# Patient Record
Sex: Male | Born: 1955 | Race: White | Hispanic: No | Marital: Married | Smoking: Former smoker
Health system: Southern US, Community
[De-identification: ages and names within clinical notes are randomized; demographics above are authoritative.]

## PROBLEM LIST (undated history)

## (undated) DIAGNOSIS — R7303 Prediabetes: Secondary | ICD-10-CM

## (undated) DIAGNOSIS — E559 Vitamin D deficiency, unspecified: Secondary | ICD-10-CM

## (undated) DIAGNOSIS — E785 Hyperlipidemia, unspecified: Secondary | ICD-10-CM

## (undated) DIAGNOSIS — I10 Essential (primary) hypertension: Secondary | ICD-10-CM

## (undated) HISTORY — DX: Vitamin D deficiency, unspecified: E55.9

## (undated) HISTORY — DX: Hyperlipidemia, unspecified: E78.5

## (undated) HISTORY — DX: Prediabetes: R73.03

## (undated) HISTORY — DX: Essential (primary) hypertension: I10

---

## 2002-10-24 ENCOUNTER — Ambulatory Visit (HOSPITAL_BASED_OUTPATIENT_CLINIC_OR_DEPARTMENT_OTHER): Admission: RE | Admit: 2002-10-24 | Discharge: 2002-10-24 | Payer: Self-pay | Admitting: Urology

## 2002-10-24 ENCOUNTER — Encounter: Payer: Self-pay | Admitting: Urology

## 2015-05-09 DIAGNOSIS — I10 Essential (primary) hypertension: Secondary | ICD-10-CM

## 2015-05-09 DIAGNOSIS — R7303 Prediabetes: Secondary | ICD-10-CM

## 2015-05-09 HISTORY — DX: Prediabetes: R73.03

## 2015-05-09 HISTORY — DX: Essential (primary) hypertension: I10

## 2015-05-21 ENCOUNTER — Encounter: Payer: Self-pay | Admitting: Internal Medicine

## 2015-05-25 ENCOUNTER — Ambulatory Visit (INDEPENDENT_AMBULATORY_CARE_PROVIDER_SITE_OTHER): Payer: BLUE CROSS/BLUE SHIELD | Admitting: Internal Medicine

## 2015-05-25 ENCOUNTER — Encounter: Payer: Self-pay | Admitting: Internal Medicine

## 2015-05-25 VITALS — BP 188/100 | HR 80 | Temp 97.0°F | Resp 16 | Ht 68.75 in | Wt 223.6 lb

## 2015-05-25 DIAGNOSIS — Z111 Encounter for screening for respiratory tuberculosis: Secondary | ICD-10-CM

## 2015-05-25 DIAGNOSIS — R7303 Prediabetes: Secondary | ICD-10-CM

## 2015-05-25 DIAGNOSIS — Z79899 Other long term (current) drug therapy: Secondary | ICD-10-CM | POA: Diagnosis not present

## 2015-05-25 DIAGNOSIS — R5383 Other fatigue: Secondary | ICD-10-CM

## 2015-05-25 DIAGNOSIS — Z0000 Encounter for general adult medical examination without abnormal findings: Secondary | ICD-10-CM | POA: Diagnosis not present

## 2015-05-25 DIAGNOSIS — I10 Essential (primary) hypertension: Secondary | ICD-10-CM | POA: Diagnosis not present

## 2015-05-25 DIAGNOSIS — Z1212 Encounter for screening for malignant neoplasm of rectum: Secondary | ICD-10-CM | POA: Diagnosis not present

## 2015-05-25 DIAGNOSIS — E669 Obesity, unspecified: Secondary | ICD-10-CM | POA: Insufficient documentation

## 2015-05-25 DIAGNOSIS — Z125 Encounter for screening for malignant neoplasm of prostate: Secondary | ICD-10-CM | POA: Diagnosis not present

## 2015-05-25 DIAGNOSIS — Z1211 Encounter for screening for malignant neoplasm of colon: Secondary | ICD-10-CM

## 2015-05-25 DIAGNOSIS — E785 Hyperlipidemia, unspecified: Secondary | ICD-10-CM

## 2015-05-25 DIAGNOSIS — B351 Tinea unguium: Secondary | ICD-10-CM

## 2015-05-25 DIAGNOSIS — Z6833 Body mass index (BMI) 33.0-33.9, adult: Secondary | ICD-10-CM

## 2015-05-25 DIAGNOSIS — K219 Gastro-esophageal reflux disease without esophagitis: Secondary | ICD-10-CM | POA: Insufficient documentation

## 2015-05-25 DIAGNOSIS — Z0001 Encounter for general adult medical examination with abnormal findings: Secondary | ICD-10-CM

## 2015-05-25 DIAGNOSIS — E782 Mixed hyperlipidemia: Secondary | ICD-10-CM | POA: Insufficient documentation

## 2015-05-25 DIAGNOSIS — E559 Vitamin D deficiency, unspecified: Secondary | ICD-10-CM

## 2015-05-25 LAB — CBC WITH DIFFERENTIAL/PLATELET
BASOS ABS: 0 10*3/uL (ref 0.0–0.1)
Basophils Relative: 1 % (ref 0–1)
EOS ABS: 0.1 10*3/uL (ref 0.0–0.7)
EOS PCT: 2 % (ref 0–5)
HEMATOCRIT: 50.5 % (ref 39.0–52.0)
Hemoglobin: 17.2 g/dL — ABNORMAL HIGH (ref 13.0–17.0)
LYMPHS ABS: 1.5 10*3/uL (ref 0.7–4.0)
LYMPHS PCT: 38 % (ref 12–46)
MCH: 30.7 pg (ref 26.0–34.0)
MCHC: 34.1 g/dL (ref 30.0–36.0)
MCV: 90.2 fL (ref 78.0–100.0)
MONO ABS: 0.4 10*3/uL (ref 0.1–1.0)
MPV: 9.9 fL (ref 8.6–12.4)
Monocytes Relative: 11 % (ref 3–12)
Neutro Abs: 1.9 10*3/uL (ref 1.7–7.7)
Neutrophils Relative %: 48 % (ref 43–77)
PLATELETS: 188 10*3/uL (ref 150–400)
RBC: 5.6 MIL/uL (ref 4.22–5.81)
RDW: 13.8 % (ref 11.5–15.5)
WBC: 4 10*3/uL (ref 4.0–10.5)

## 2015-05-25 LAB — BASIC METABOLIC PANEL WITH GFR
BUN: 10 mg/dL (ref 7–25)
CALCIUM: 9.7 mg/dL (ref 8.6–10.3)
CO2: 26 mmol/L (ref 20–31)
CREATININE: 1.09 mg/dL (ref 0.70–1.25)
Chloride: 102 mmol/L (ref 98–110)
GFR, EST AFRICAN AMERICAN: 85 mL/min (ref >=60)
GFR, Est Non African American: 73 mL/min (ref >=60)
GLUCOSE: 88 mg/dL (ref 65–99)
POTASSIUM: 4.3 mmol/L (ref 3.5–5.3)
Sodium: 139 mmol/L (ref 135–146)

## 2015-05-25 LAB — HEPATIC FUNCTION PANEL
ALBUMIN: 4.6 g/dL (ref 3.6–5.1)
ALT: 33 U/L (ref 9–46)
AST: 26 U/L (ref 10–35)
Alkaline Phosphatase: 52 U/L (ref 40–115)
BILIRUBIN TOTAL: 0.6 mg/dL (ref 0.2–1.2)
Bilirubin, Direct: 0.2 mg/dL (ref <=0.2)
Indirect Bilirubin: 0.4 mg/dL (ref 0.2–1.2)
TOTAL PROTEIN: 7.3 g/dL (ref 6.1–8.1)

## 2015-05-25 LAB — MAGNESIUM: MAGNESIUM: 1.8 mg/dL (ref 1.5–2.5)

## 2015-05-25 LAB — TSH: TSH: 1.47 mIU/L (ref 0.40–4.50)

## 2015-05-25 LAB — LIPID PANEL
CHOLESTEROL: 180 mg/dL (ref 125–200)
HDL: 46 mg/dL (ref >=40)
LDL CALC: 112 mg/dL (ref <130)
TRIGLYCERIDES: 110 mg/dL (ref <150)
Total CHOL/HDL Ratio: 3.9 Ratio (ref <=5.0)
VLDL: 22 mg/dL (ref <30)

## 2015-05-25 LAB — IRON AND TIBC
%SAT: 37 % (ref 15–60)
IRON: 103 ug/dL (ref 50–180)
TIBC: 280 ug/dL (ref 250–425)
UIBC: 177 ug/dL (ref 125–400)

## 2015-05-25 LAB — VITAMIN B12: Vitamin B-12: 345 pg/mL (ref 200–1100)

## 2015-05-25 LAB — HEMOGLOBIN A1C
Hgb A1c MFr Bld: 6.1 % — ABNORMAL HIGH (ref <5.7)
MEAN PLASMA GLUCOSE: 128 mg/dL — AB (ref <117)

## 2015-05-25 MED ORDER — LISINOPRIL 20 MG PO TABS: ORAL_TABLET | ORAL | Status: DC

## 2015-05-25 MED ORDER — BISOPROLOL-HYDROCHLOROTHIAZIDE 5-6.25 MG PO TABS: ORAL_TABLET | ORAL | Status: DC

## 2015-05-25 MED ORDER — TERBINAFINE HCL 250 MG PO TABS: ORAL_TABLET | ORAL | Status: DC

## 2015-05-25 NOTE — Patient Instructions (Addendum)
Recommend Adult Low Dose Aspirin or   coated  Aspirin 81 mg daily   To reduce risk of Colon Cancer 20 %,   Skin Cancer 26 % ,   Melanoma 46%   and   Pancreatic cancer 60%   ++++++++++++++++++++++++++++++++++++++++++++++++++++++ Vitamin D goal   is between 70-100.   Please make sure that you are taking your Vitamin D as directed.   It is very important as a natural anti-inflammatory   helping hair, skin, and nails, as well as reducing stroke and heart attack risk.   It helps your bones and helps with mood.  It also decreases numerous cancer risks so please take it as directed.   Low Vit D is associated with a 200-300% higher risk for CANCER   and 200-300% higher risk for HEART   ATTACK  &  STROKE.   ......................................  It is also associated with higher death rate at younger ages,   autoimmune diseases like Rheumatoid arthritis, Lupus, Multiple Sclerosis.     Also many other serious conditions, like depression, Alzheimer's  Dementia, infertility, muscle aches, fatigue, fibromyalgia - just to name a few.  ++++++++++++++++++++++++++++++++++++++++++++++++  Recommend the book "The END of DIETING" by Dr Joel Fuhrman   & the book "The END of DIABETES " by Dr Joel Fuhrman  At [employer] - get book & Audio CD's     Being diabetic has a  300% increased risk for heart attack, stroke, cancer, and alzheimer- type vascular dementia. It is very important that you work harder with diet by avoiding all foods that are white. Avoid white rice (brown & wild rice is OK), white potatoes (sweetpotatoes in moderation is OK), White bread or wheat bread or anything made out of white flour like bagels, donuts, rolls, buns, biscuits, cakes, pastries, cookies, pizza crust, and pasta (made from white flour & egg whites) - vegetarian pasta or spinach or wheat pasta is OK. Multigrain breads like Arnold's or [employer], or multigrain sandwich thins or flatbreads.  Diet,  exercise and weight loss can reverse and cure diabetes in the early stages.  Diet, exercise and weight loss is very important in the control and prevention of complications of diabetes which affects every system in your body, ie. Brain - dementia/stroke, eyes - glaucoma/blindness, heart - heart attack/heart failure, kidneys - dialysis, stomach - gastric paralysis, intestines - malabsorption, nerves - severe painful neuritis, circulation - gangrene & loss of a leg(s), and finally cancer and Alzheimers.    I recommend avoid fried & greasy foods,  sweets/candy, white rice (brown or wild rice or Quinoa is OK), white potatoes (sweet potatoes are OK) - anything made from white flour - bagels, doughnuts, rolls, buns, biscuits,white and wheat breads, pizza crust and traditional pasta made of white flour & egg white(vegetarian pasta or spinach or wheat pasta is OK).  Multi-grain bread is OK - like multi-grain flat bread or sandwich thins. Avoid alcohol in excess. Exercise is also important.    Eat all the vegetables you want - avoid meat, especially red meat and dairy - especially cheese.  Cheese is the most concentrated form of trans-fats which is the worst thing to clog up our arteries. Veggie cheese is OK which can be found in the fresh produce section at [employer] or [employer] or [employer]  ++++++++++++++++++++++++++++++++++++++++++++++++++ DASH Eating Plan  DASH stands for "Dietary Approaches to Stop Hypertension."   The DASH eating plan is a healthy eating plan that has been shown to reduce high blood   pressure (hypertension). Additional health benefits may include reducing the risk of type 2 diabetes mellitus, heart disease, and stroke. The DASH eating plan may also help with weight loss.  WHAT DO I NEED TO KNOW ABOUT THE DASH EATING PLAN?  For the DASH eating plan, you will follow these general guidelines:  Choose foods with a percent daily value for sodium of less than 5% (as listed on the food  label).  Use salt-free seasonings or herbs instead of table salt or sea salt.  Check with your health care provider or pharmacist before using salt substitutes.  Eat lower-sodium products, often labeled as "lower sodium" or "no salt added."  Eat fresh foods.  Eat more vegetables, fruits, and low-fat dairy products.    Choose whole grains. Look for the word "whole" as the first word in the ingredient list.  Choose fish   Limit sweets, desserts, sugars, and sugary drinks.  Choose heart-healthy fats.  Eat veggie cheese   Eat more home-cooked food and less restaurant, buffet, and fast food.  Limit fried foods.  Cook foods using methods other than frying.  Limit canned vegetables. If you do use them, rinse them well to decrease the sodium.  When eating at a restaurant, ask that your food be prepared with less salt, or no salt if possible.                      WHAT FOODS CAN I EAT?  Read Dr Joel Fuhrman's books on The End of Dieting & The End of Diabetes  Grains  Whole grain or whole wheat bread. Brown rice. Whole grain or whole wheat pasta. Quinoa, bulgur, and whole grain cereals. Low-sodium cereals. Corn or whole wheat flour tortillas. Whole grain cornbread. Whole grain crackers. Low-sodium crackers.  Vegetables  Fresh or frozen vegetables (raw, steamed, roasted, or grilled). Low-sodium or reduced-sodium tomato and vegetable juices. Low-sodium or reduced-sodium tomato sauce and paste. Low-sodium or reduced-sodium canned vegetables.   Fruits  All fresh, canned (in natural juice), or frozen fruits.  Protein Products   All fish and seafood.  Dried beans, peas, or lentils. Unsalted nuts and seeds. Unsalted canned beans.  Dairy  Low-fat dairy products, such as skim or 1% milk, 2% or reduced-fat cheeses, low-fat ricotta or cottage cheese, or plain low-fat yogurt. Low-sodium or reduced-sodium cheeses.  Fats and Oils  Tub margarines without trans fats. Light or  reduced-fat mayonnaise and salad dressings (reduced sodium). Avocado. Safflower, olive, or canola oils. Natural peanut or almond butter.  Other  Unsalted popcorn and pretzels. The items listed above may not be a complete list of recommended foods or beverages. Contact your dietitian for more options.  +++++++++++++++++++++++++++++++++++++++++++  WHAT FOODS ARE NOT RECOMMENDED?  Grains/ White flour or wheat flour  White bread. White pasta. White rice. Refined cornbread. Bagels and croissants. Crackers that contain trans fat.  Vegetables  Creamed or fried vegetables. Vegetables in a . Regular canned vegetables. Regular canned tomato sauce and paste. Regular tomato and vegetable juices.  Fruits  Dried fruits. Canned fruit in light or heavy syrup. Fruit juice.  Meat and Other Protein Products  Meat in general - RED mwaet & White meat.  Fatty cuts of meat. Ribs, chicken wings, bacon, sausage, bologna, salami, chitterlings, fatback, hot dogs, bratwurst, and packaged luncheon meats.  Dairy  Whole or 2% milk, cream, half-and-half, and cream cheese. Whole-fat or sweetened yogurt. Full-fat cheeses or blue cheese. Nondairy creamers and whipped toppings. Processed cheese, cheese spreads, or   cheese curds.  Condiments  Onion and garlic salt, seasoned salt, table salt, and sea salt. Canned and packaged gravies. Worcestershire sauce. Tartar sauce. Barbecue sauce. Teriyaki sauce. Soy sauce, including reduced sodium. Steak sauce. Fish sauce. Oyster sauce. Cocktail sauce. Horseradish. Ketchup and mustard. Meat flavorings and tenderizers. Bouillon cubes. Hot sauce. Tabasco sauce. Marinades. Taco seasonings. Relishes.  Fats and Oils Butter, stick margarine, lard, shortening and bacon fat. Coconut, palm kernel, or palm oils. Regular salad dressings.  Pickles and olives. Salted popcorn and pretzels.  The items listed above may not be a complete list of foods and beverages to avoid.   Preventive  Care for Adults  A healthy lifestyle and preventive care can promote health and wellness. Preventive health guidelines for men include the following key practices:  A routine yearly physical is a good way to check with your health care provider about your health and preventative screening. It is a chance to share any concerns and updates on your health and to receive a thorough exam.  Visit your dentist for a routine exam and preventative care every 6 months. Brush your teeth twice a day and floss once a day. Good oral hygiene prevents tooth decay and gum disease.  The frequency of eye exams is based on your age, health, family medical history, use of contact lenses, and other factors. Follow your health care provider's recommendations for frequency of eye exams.  Eat a healthy diet. Foods such as vegetables, fruits, whole grains, low-fat dairy products, and lean protein foods contain the nutrients you need without too many calories. Decrease your intake of foods high in solid fats, added sugars, and salt. Eat the right amount of calories for you.Get information about a proper diet from your health care provider, if necessary.  Regular physical exercise is one of the most important things you can do for your health. Most adults should get at least 150 minutes of moderate-intensity exercise (any activity that increases your heart rate and causes you to sweat) each week. In addition, most adults need muscle-strengthening exercises on 2 or more days a week.  Maintain a healthy weight. The body mass index (BMI) is a screening tool to identify possible weight problems. It provides an estimate of body fat based on height and weight. Your health care provider can find your BMI and can help you achieve or maintain a healthy weight.For adults 20 years and older:  A BMI below 18.5 is considered underweight.  A BMI of 18.5 to 24.9 is normal.  A BMI of 25 to 29.9 is considered overweight.  A BMI of 30  and above is considered obese.  Maintain normal blood lipids and cholesterol levels by exercising and minimizing your intake of saturated fat. Eat a balanced diet with plenty of fruit and vegetables. Blood tests for lipids and cholesterol should begin at age 20 and be repeated every 5 years. If your lipid or cholesterol levels are high, you are over 50, or you are at high risk for heart disease, you may need your cholesterol levels checked more frequently.Ongoing high lipid and cholesterol levels should be treated with medicines if diet and exercise are not working.  If you smoke, find out from your health care provider how to quit. If you do not use tobacco, do not start.  Lung cancer screening is recommended for adults aged 55-80 years who are at high risk for developing lung cancer because of a history of smoking. A yearly low-dose CT scan of the lungs is   recommended for people who have at least a 30-pack-year history of smoking and are a current smoker or have quit within the past 15 years. A pack year of smoking is smoking an average of 1 pack of cigarettes a day for 1 year (for example: 1 pack a day for 30 years or 2 packs a day for 15 years). Yearly screening should continue until the smoker has stopped smoking for at least 15 years. Yearly screening should be stopped for people who develop a health problem that would prevent them from having lung cancer treatment.  If you choose to drink alcohol, do not have more than 2 drinks per day. One drink is considered to be 12 ounces (355 mL) of beer, 5 ounces (148 mL) of wine, or 1.5 ounces (44 mL) of liquor.  Avoid use of street drugs. Do not share needles with anyone. Ask for help if you need support or instructions about stopping the use of drugs.  High blood pressure causes heart disease and increases the risk of stroke. Your blood pressure should be checked at least every 1-2 years. Ongoing high blood pressure should be treated with medicines, if  weight loss and exercise are not effective.  If you are 45-79 years old, ask your health care provider if you should take aspirin to prevent heart disease.  Diabetes screening involves taking a blood sample to check your fasting blood sugar level. This should be done once every 3 years, after age 45, if you are within normal weight and without risk factors for diabetes. Testing should be considered at a younger age or be carried out more frequently if you are overweight and have at least 1 risk factor for diabetes.  Colorectal cancer can be detected and often prevented. Most routine colorectal cancer screening begins at the age of 50 and continues through age 75. However, your health care provider may recommend screening at an earlier age if you have risk factors for colon cancer. On a yearly basis, your health care provider may provide home test kits to check for hidden blood in the stool. Use of a small camera at the end of a tube to directly examine the colon (sigmoidoscopy or colonoscopy) can detect the earliest forms of colorectal cancer. Talk to your health care provider about this at age 50, when routine screening begins. Direct exam of the colon should be repeated every 5-10 years through age 75, unless early forms of precancerous polyps or small growths are found.   Talk with your health care provider about prostate cancer screening.  Testicular cancer screening isrecommended for adult males. Screening includes self-exam, a health care provider exam, and other screening tests. Consult with your health care provider about any symptoms you have or any concerns you have about testicular cancer.  Use sunscreen. Apply sunscreen liberally and repeatedly throughout the day. You should seek shade when your shadow is shorter than you. Protect yourself by wearing long sleeves, pants, a wide-brimmed hat, and sunglasses year round, whenever you are outdoors.  Once a month, do a whole-body skin exam, using  a mirror to look at the skin on your back. Tell your health care provider about new moles, moles that have irregular borders, moles that are larger than a pencil eraser, or moles that have changed in shape or color.  Stay current with required vaccines (immunizations).  Influenza vaccine. All adults should be immunized every year.  Tetanus, diphtheria, and acellular pertussis (Td, Tdap) vaccine. An adult who has not previously   received Tdap or who does not know his vaccine status should receive 1 dose of Tdap. This initial dose should be followed by tetanus and diphtheria toxoids (Td) booster doses every 10 years. Adults with an unknown or incomplete history of completing a 3-dose immunization series with Td-containing vaccines should begin or complete a primary immunization series including a Tdap dose. Adults should receive a Td booster every 10 years.  Varicella vaccine. An adult without evidence of immunity to varicella should receive 2 doses or a second dose if he has previously received 1 dose.  Human papillomavirus (HPV) vaccine. Males aged 13-21 years who have not received the vaccine previously should receive the 3-dose series. Males aged 22-26 years may be immunized. Immunization is recommended through the age of 26 years for any male who has sex with males and did not get any or all doses earlier. Immunization is recommended for any person with an immunocompromised condition through the age of 26 years if he did not get any or all doses earlier. During the 3-dose series, the second dose should be obtained 4-8 weeks after the first dose. The third dose should be obtained 24 weeks after the first dose and 16 weeks after the second dose.  Zoster vaccine. One dose is recommended for adults aged 60 years or older unless certain conditions are present.    PREVNAR  - Pneumococcal 13-valent conjugate (PCV13) vaccine. When indicated, a person who is uncertain of his immunization history and has no  record of immunization should receive the PCV13 vaccine. An adult aged 19 years or older who has certain medical conditions and has not been previously immunized should receive 1 dose of PCV13 vaccine. This PCV13 should be followed with a dose of pneumococcal polysaccharide (PPSV23) vaccine. The PPSV23 vaccine dose should be obtained at least 8 weeks after the dose of PCV13 vaccine. An adult aged 19 years or older who has certain medical conditions and previously received 1 or more doses of PPSV23 vaccine should receive 1 dose of PCV13. The PCV13 vaccine dose should be obtained 1 or more years after the last PPSV23 vaccine dose.    PNEUMOVAX - Pneumococcal polysaccharide (PPSV23) vaccine. When PCV13 is also indicated, PCV13 should be obtained first. All adults aged 65 years and older should be immunized. An adult younger than age 65 years who has certain medical conditions should be immunized. Any person who resides in a nursing home or long-term care facility should be immunized. An adult smoker should be immunized. People with an immunocompromised condition and certain other conditions should receive both PCV13 and PPSV23 vaccines. People with human immunodeficiency virus (HIV) infection should be immunized as soon as possible after diagnosis. Immunization during chemotherapy or radiation therapy should be avoided. Routine use of PPSV23 vaccine is not recommended for American Indians, [place] Natives, or people younger than 65 years unless there are medical conditions that require PPSV23 vaccine. When indicated, people who have unknown immunization and have no record of immunization should receive PPSV23 vaccine. One-time revaccination 5 years after the first dose of PPSV23 is recommended for people aged 19-64 years who have chronic kidney failure, nephrotic syndrome, asplenia, or immunocompromised conditions. People who received 1-2 doses of PPSV23 before age 65 years should receive another dose of PPSV23  vaccine at age 65 years or later if at least 5 years have passed since the previous dose. Doses of PPSV23 are not needed for people immunized with PPSV23 at or after age 65 years.    Hepatitis   A vaccine. Adults who wish to be protected from this disease, have certain high-risk conditions, work with hepatitis A-infected animals, work in hepatitis A research labs, or travel to or work in countries with a high rate of hepatitis A should be immunized. Adults who were previously unvaccinated and who anticipate close contact with an international adoptee during the first 60 days after arrival in the [place] from a country with a high rate of hepatitis A should be immunized.    Hepatitis B vaccine. Adults should be immunized if they wish to be protected from this disease, have certain high-risk conditions, may be exposed to blood or other infectious body fluids, are household contacts or sex partners of hepatitis B positive people, are clients or workers in certain care facilities, or travel to or work in countries with a high rate of hepatitis B.   Preventive Service / Frequency   Ages 34 to 15  Blood pressure check.  Lipid and cholesterol check  Lung cancer screening. / Every year if you are aged 55-80 years and have a 30-pack-year history of smoking and currently smoke or have quit within the past 15 years. Yearly screening is stopped once you have quit smoking for at least 15 years or develop a health problem that would prevent you from having lung cancer treatment.  Fecal occult blood test (FOBT) of stool. / Every year beginning at age 65 and continuing until age 106. You may not have to do this test if you get a colonoscopy every 10 years.  Flexible sigmoidoscopy** or colonoscopy.** / Every 5 years for a flexible sigmoidoscopy or every 10 years for a colonoscopy beginning at age 49 and continuing until age 42. Screening for abdominal aortic aneurysm (AAA)  by ultrasound is recommended  for people who have history of high blood pressure or who are current or former smokers.  ++++++++++++++++++++++++++++++++++++++++++++++++++++++ Food Choices for Gastroesophageal Reflux Disease, Adult When you have gastroesophageal reflux disease (GERD), the foods you eat and your eating habits are very important. Choosing the right foods can help ease the discomfort of GERD. WHAT GENERAL GUIDELINES DO I NEED TO FOLLOW?  Choose fruits, vegetables, whole grains, low-fat dairy products, and low-fat meat, fish, and poultry.  Limit fats such as oils, salad dressings, butter, nuts, and avocado.  Keep a food diary to identify foods that cause symptoms.  Avoid foods that cause reflux. These may be different for different people.  Eat frequent small meals instead of three large meals each day.  Eat your meals slowly, in a relaxed setting.  Limit fried foods.  Cook foods using methods other than frying.  Avoid drinking alcohol.  Avoid drinking large amounts of liquids with your meals.  Avoid bending over or lying down until 2-3 hours after eating. WHAT FOODS ARE NOT RECOMMENDED? The following are some foods and drinks that may worsen your symptoms: Vegetables Tomatoes. Tomato juice. Tomato and spaghetti sauce. Chili peppers. Onion and garlic. Horseradish. Fruits Oranges, grapefruit, and lemon (fruit and juice). Meats High-fat meats, fish, and poultry. This includes hot dogs, ribs, ham, sausage, salami, and bacon. Dairy Whole milk and chocolate milk. Sour cream. Cream. Butter. Ice cream. Cream cheese.  Beverages Coffee and tea, with or without caffeine. Carbonated beverages or energy drinks. Condiments Hot sauce. Barbecue sauce.  Sweets/Desserts Chocolate and cocoa. Donuts. Peppermint and spearmint. Fats and Oils High-fat foods, including [place] fries and potato chips. Other Vinegar. Strong spices, such as black pepper, white pepper, red pepper, cayenne, curry powder, cloves,  ginger, and chili powder. The items listed above may not be a complete list of foods and beverages to avoid. Contact your dietitian for more information.

## 2015-05-25 NOTE — Addendum Note (Signed)
Addended by: Lucky Cowboy on: 05/25/2015 08:04 PM   Modules accepted: Level of Service

## 2015-05-25 NOTE — Progress Notes (Signed)
Patient ID: Wesley Oneal, male   DOB: 07/27/1955, 60 y.o.   MRN: [id number]  Annual  Screening/Preventative Visit And Comprehensive Evaluation & Examination  This very nice 60 y.o. [name] presents as a new patient  for a Wellness/Preventative Visit & comprehensive evaluation and management of multiple medical co-morbidities.     Patient had an eye exam about 2 weeks ago and was advised that his BP was elevated at 175/100. Patient's BP has been controlled at home.Today's BP is 188/100. Patient denies any cardiac symptoms as chest pain, palpitations, shortness of breath, dizziness or ankle swelling.   Patient has hx/o  hyperlipidemia in the past with elevated TC 216, TG289, HDL58 & LDL115 in 2010.    Patient has Morbid obesity (BMI 33+) and is proactively screened for Diabetes. Patient denies reactive hypoglycemic symptoms, visual blurring, diabetic polys or paresthesias.      Finally, patient did have a vit D of "37" in 2010 & is thus screened for Vitamin D Deficiency.    On no meds.   Allergies  Allergen Reactions  . Penicillins    No past medical history on file. Health Maintenance  Topic Date Due  . Hepatitis C Screening  Jan 04, 1956  . HIV Screening  05/21/1970  . TETANUS/TDAP  05/21/1974  . COLONOSCOPY  05/21/2005  . INFLUENZA VACCINE  11/06/2014  . ZOSTAVAX  05/22/2015   Immunization History  Administered Date(s) Administered  . Td 09/06/2014   No past surgical history on file. No family history on file.  Social History   Social History  . Marital Status: Married    Spouse Name: N/A  . Number of Children: N/A  . Years of Education: N/A   Occupational History  . [employer] - semi-retired   Social History Main Topics  . Smoking status: Former Games developer  . Smokeless tobacco: Former Neurosurgeon    Quit date: 04/07/2001  . Alcohol Use: 14.4 oz/week    24 Standard drinks or equivalent per week     Comment: 1 case of beer and 2 shots of vodka  . Drug Use: Not on file  . Sexual  Activity: Active    ROS Constitutional: Denies fever, chills, weight loss/gain, headaches, insomnia,  night sweats or change in appetite. Does c/o fatigue. Eyes: Denies redness, blurred vision, diplopia, discharge, itchy or watery eyes.  ENT: Denies discharge, congestion, post nasal drip, epistaxis, sore throat, earache, hearing loss, dental pain, Tinnitus, Vertigo, Sinus pain or snoring.  Cardio: Denies chest pain, palpitations, irregular heartbeat, syncope, dyspnea, diaphoresis, orthopnea, PND, claudication or edema Respiratory: denies cough, dyspnea, DOE, pleurisy, hoarseness, laryngitis or wheezing.  Gastrointestinal: Denies dysphagia, heartburn, reflux, water brash, pain, cramps, nausea, vomiting, bloating, diarrhea, constipation, hematemesis, melena, hematochezia, jaundice or hemorrhoids Genitourinary: Denies dysuria, frequency, urgency, nocturia, hesitancy, discharge, hematuria or flank pain Musculoskeletal: Denies arthralgia, myalgia, stiffness, Jt. Swelling, pain, limp or strain/sprain. Denies Falls. Skin: Denies puritis, rash, hives, warts, acne, eczema or change in skin lesion Neuro: No weakness, tremor, incoordination, spasms, paresthesia or pain Psychiatric: Denies confusion, memory loss or sensory loss. Denies Depression. Endocrine: Denies change in weight, skin, hair change, nocturia, and paresthesia, diabetic polys, visual blurring or hyper / hypo glycemic episodes.  Heme/Lymph: No excessive bleeding, bruising or enlarged lymph nodes.  Physical Exam  BP 188/100 mmHg  Pulse 80  Temp(Src) 97 F (36.1 C)  Resp 16  Ht 5' 8.75" (1.746 m)  Wt 223 lb 9.6 oz (101.424 kg)  BMI 33.27 kg/m2  General Appearance: Well nourished, in no  apparent distress. Eyes: PERRLA, EOMs, conjunctiva no swelling or erythema, normal fundi and vessels. Sinuses: No frontal/maxillary tenderness ENT/Mouth: EACs patent / TMs  nl. Nares clear without erythema, swelling, mucoid exudates. Oral hygiene is  good. No erythema, swelling, or exudate. Tongue normal, non-obstructing. Tonsils not swollen or erythematous. Hearing normal.  Neck: Supple, thyroid normal. No bruits, nodes or JVD. Respiratory: Respiratory effort normal.  BS equal and clear bilateral without rales, rhonci, wheezing or stridor. Cardio: Heart sounds are normal with regular rate and rhythm and no murmurs, rubs or gallops. Peripheral pulses are normal and equal bilaterally without edema. No aortic or femoral bruits. Chest: symmetric with normal excursions and percussion.  Abdomen: Soft, rotund with Nl bowel sounds. Nontender, no guarding, rebound, hernias, masses, or organomegaly.  Lymphatics: Non tender without lymphadenopathy.  Genitourinary: No hernias.Testes nl. DRE - prostate nl for age - smooth & firm w/o nodules. Musculoskeletal: Full ROM all peripheral extremities, joint stability, 5/5 strength, and normal gait. Skin: Warm and dry without rashes, lesions, cyanosis, clubbing or  ecchymosis. Bilat 1st toenails are thick, discolored & chalky.  Neuro: Cranial nerves intact, reflexes equal bilaterally. Normal muscle tone, no cerebellar symptoms. Sensation intact.  Pysch: Alert and oriented X 3 with normal affect, insight and judgment appropriate.   Assessment and Plan  1. Annual Preventative/Screening Exam   - Microalbumin / creatinine urine ratio - EKG 12-Lead - [place], RETROPERITNL ABD,  LTD - POC Hemoccult Bld/Stl ( - Urinalysis, Routine w reflex microscopic  - Vitamin B12 - Iron and TIBC - PSA - Testosterone - CBC with Differential/Platelet - BASIC METABOLIC PANEL WITH GFR - Hepatic function panel - Magnesium - Lipid panel - TSH - Hemoglobin A1c - Insulin, random - VITAMIN D 25 Hydroxy  2. Essential hypertension  - Microalbumin / creatinine urine ratio - EKG 12-Lead - [place], RETROPERITNL ABD,  LTD - TSH - bisoprolol-hydrochlorothiazide (ZIAC) 5-6.25 MG tablet; Take 1 tablet every morning for BP  Dispense: 90  tablet; Refill: 1 - lisinopril (PRINIVIL,ZESTRIL) 20 MG tablet; Take 1 tablet every night for BP  Dispense: 90 tablet; Refill: 1  3. Hyperlipemia  - Lipid panel - TSH  4. Prediabetes  - Hemoglobin A1c - Insulin, random  5. Vitamin D deficiency  - Cholecalciferol (VITAMIN D PO); Take 5,000 Units by mouth daily. - VITAMIN D 25 Hydroxy   6. Morbid obesity (HCC)   7. BMI 33.0-33.9,adult   8. Gastroesophageal reflux disease  - Omeprazole (PRILOSEC OTC); Take 20 mg by mouth daily.  9. Other fatigue  - Vitamin B12 - Iron and TIBC - Testosterone - CBC with Differential/Platelet - TSH  10. Screening for rectal cancer  - POC Hemoccult Bld/Stl   11. Prostate cancer screening  - PSA  12. Onychomycosis of toenail  - terbinafine (LAMISIL) 250 MG tablet; Take 1 tablet daily for fungal nail infection  Dispense: 90 tablet; Refill: 0  13. Special screening for malignant neoplasms, colon  - Ambulatory referral to Colorectal Surgery  14. Screening examination for pulmonary tuberculosis  - PPD  15. Medication management  - naproxen sodium (ANAPROX) 220 MG tablet; Take 220 mg by mouth 2 (two) times daily as needed. - Urinalysis, Routine w reflex microscopic  - CBC with Differential/Platelet - BASIC METABOLIC PANEL WITH GFR - Hepatic function panel - Magnesium   Continue prudent diet as discussed, weight control, BP monitoring, regular exercise, and medications as discussed.  Discussed med effects and SE's. Routine screening labs and tests as requested with regular follow-up as  recommended. Over 40 minutes of exam, counseling, chart review and high complex critical decision making was performed

## 2015-05-26 LAB — URINALYSIS, ROUTINE W REFLEX MICROSCOPIC
Bilirubin Urine: NEGATIVE
GLUCOSE, UA: NEGATIVE
HGB URINE DIPSTICK: NEGATIVE
KETONES UR: NEGATIVE
Leukocytes, UA: NEGATIVE
Nitrite: NEGATIVE
PH: 7 (ref 5.0–8.0)
Protein, ur: NEGATIVE
SPECIFIC GRAVITY, URINE: 1.02 (ref 1.001–1.035)

## 2015-05-26 LAB — MICROALBUMIN / CREATININE URINE RATIO
CREATININE, URINE: 185 mg/dL (ref 20–370)
MICROALB UR: 4.2 mg/dL
Microalb Creat Ratio: 23 mcg/mg creat (ref <30)

## 2015-05-26 LAB — TESTOSTERONE: Testosterone: 545 ng/dL (ref 250–827)

## 2015-05-26 LAB — VITAMIN D 25 HYDROXY (VIT D DEFICIENCY, FRACTURES): VIT D 25 HYDROXY: 67 ng/mL (ref 30–100)

## 2015-05-26 LAB — PSA: PSA: 1.3 ng/mL (ref <=4.00)

## 2015-05-28 LAB — TB SKIN TEST
INDURATION: 0 mm
TB SKIN TEST: NEGATIVE

## 2015-05-28 LAB — INSULIN, RANDOM: INSULIN: 19.3 u[IU]/mL (ref 2.0–19.6)

## 2015-06-15 ENCOUNTER — Encounter: Payer: Self-pay | Admitting: Internal Medicine

## 2015-06-15 ENCOUNTER — Ambulatory Visit (INDEPENDENT_AMBULATORY_CARE_PROVIDER_SITE_OTHER): Payer: BLUE CROSS/BLUE SHIELD | Admitting: Physician Assistant

## 2015-06-15 VITALS — BP 138/76 | HR 60 | Temp 97.3°F | Resp 16 | Ht 68.75 in | Wt 218.4 lb

## 2015-06-15 DIAGNOSIS — I10 Essential (primary) hypertension: Secondary | ICD-10-CM | POA: Diagnosis not present

## 2015-06-15 NOTE — Progress Notes (Signed)
   Subjective:    Patient ID: Wesley Oneal, male    DOB: 1955/06/10, 60 y.o.   MRN: [id number]  HPI 60 y.o. WM presents for reevaluation of BP.   Blood pressure 138/76, pulse 60, temperature 97.3 F (36.3 C), resp. rate 16, height 5' 8.75" (1.746 m), weight 218 lb 6.4 oz (99.066 kg).  Current Outpatient Prescriptions on File Prior to Visit  Medication Sig Dispense Refill  . bisoprolol-hydrochlorothiazide (ZIAC) 5-6.25 MG tablet Take 1 tablet every morning for BP 90 tablet 1  . Cholecalciferol (VITAMIN D PO) Take 5,000 Units by mouth daily.    Marland Kitchen. lisinopril (PRINIVIL,ZESTRIL) 20 MG tablet Take 1 tablet every night for BP 90 tablet 1  . naproxen sodium (ANAPROX) 220 MG tablet Take 220 mg by mouth 2 (two) times daily as needed.    . Omeprazole Magnesium (PRILOSEC OTC PO) Take 20 mg by mouth daily.    Marland Kitchen. terbinafine (LAMISIL) 250 MG tablet Take 1 tablet daily for fungal nail infection 90 tablet 0   No current facility-administered medications on file prior to visit.   No past medical history on file.   Review of Systems  Constitutional: Negative.   HENT: Negative.   Respiratory: Negative.   Cardiovascular: Negative.   Gastrointestinal: Negative.   Genitourinary: Negative.   Musculoskeletal: Negative.   Skin: Negative.   Neurological: Negative.   Psychiatric/Behavioral: Negative.        Objective:   Physical Exam  Constitutional: He is oriented to person, place, and time. He appears well-developed and well-nourished.  Obese  HENT:  Head: Normocephalic and atraumatic.  Right Ear: External ear normal.  Left Ear: External ear normal.  Mouth/Throat: Oropharynx is clear and moist.  + crowded mouth  Eyes: EOM are normal. Pupils are equal, round, and reactive to light. Right conjunctiva is not injected. Right conjunctiva has a hemorrhage. Left conjunctiva is not injected. Left conjunctiva has no hemorrhage.  Neck: Normal range of motion. Neck supple.  Cardiovascular: Normal  rate, regular rhythm and normal heart sounds.   Pulmonary/Chest: Effort normal and breath sounds normal.  Abdominal: Soft. Bowel sounds are normal.  Musculoskeletal: Normal range of motion.  Neurological: He is alert and oriented to person, place, and time. No cranial nerve deficit.  Skin: Skin is warm and dry.  Psychiatric: He has a normal mood and affect. His behavior is normal.       Assessment & Plan:  1. Essential hypertension Continue medications, monitor at home Follow up 3 months Diet discussed, decrease beer/sweets ? Sleep apnea information given   Future Appointments Date Time Provider Department Center  06/25/2016 9:00 AM Lucky CowboyWilliam McKeown, MD [facility] None

## 2015-06-15 NOTE — Patient Instructions (Addendum)
I think it is possible that you have sleep apnea. It can cause interrupted sleep, headaches, frequent awakenings, fatigue, dry mouth, fast/slow heart beats, memory issues, anxiety/depression, swelling, numbness tingling hands/feet, weight gain, shortness of breath, and the list goes on. Sleep apnea needs to be ruled out because if it is left untreated it does eventually lead to abnormal heart beats, lung failure or heart failure as well as increasing the risk of heart attack and stroke. There are masks you can wear OR a mouth piece that I can give you information about. Often times though people feel MUCH better after getting treatment.   Sleep Apnea  Sleep apnea is a sleep disorder characterized by abnormal pauses in breathing while you sleep. When your breathing pauses, the level of oxygen in your blood decreases. This causes you to move out of deep sleep and into light sleep. As a result, your quality of sleep is poor, and the system that carries your blood throughout your body (cardiovascular system) experiences stress. If sleep apnea remains untreated, the following conditions can develop:  High blood pressure (hypertension).  Coronary artery disease.  Inability to achieve or maintain an erection (impotence).  Impairment of your thought process (cognitive dysfunction). There are three types of sleep apnea: 1. Obstructive sleep apnea--Pauses in breathing during sleep because of a blocked airway. 2. Central sleep apnea--Pauses in breathing during sleep because the area of the brain that controls your breathing does not send the correct signals to the muscles that control breathing. 3. Mixed sleep apnea--A combination of both obstructive and central sleep apnea.  RISK FACTORS The following risk factors can increase your risk of developing sleep apnea:  Being overweight.  Smoking.  Having narrow passages in your nose and throat.  Being of older age.  Being male.  Alcohol use.   Sedative and tranquilizer use.  Ethnicity. Among individuals younger than 35 years, African Americans are at increased risk of sleep apnea. SYMPTOMS   Difficulty staying asleep.  Daytime sleepiness and fatigue.  Loss of energy.  Irritability.  Loud, heavy snoring.  Morning headaches.  Trouble concentrating.  Forgetfulness.  Decreased interest in sex. DIAGNOSIS  In order to diagnose sleep apnea, your caregiver will perform a physical examination. Your caregiver may suggest that you take a home sleep test. Your caregiver may also recommend that you spend the night in a sleep lab. In the sleep lab, several monitors record information about your heart, lungs, and brain while you sleep. Your leg and arm movements and blood oxygen level are also recorded. TREATMENT The following actions may help to resolve mild sleep apnea:  Sleeping on your side.   Using a decongestant if you have nasal congestion.   Avoiding the use of depressants, including alcohol, sedatives, and narcotics.   Losing weight and modifying your diet if you are overweight. There also are devices and treatments to help open your airway:  Oral appliances. These are custom-made mouthpieces that shift your lower jaw forward and slightly open your bite. This opens your airway.  Devices that create positive airway pressure. This positive pressure "splints" your airway open to help you breathe better during sleep. The following devices create positive airway pressure:  Continuous positive airway pressure (CPAP) device. The CPAP device creates a continuous level of air pressure with an air pump. The air is delivered to your airway through a mask while you sleep. This continuous pressure keeps your airway open.  Nasal expiratory positive airway pressure (EPAP) device. The EPAP device  creates positive air pressure as you exhale. The device consists of single-use valves, which are inserted into each nostril and held in  place by adhesive. The valves create very little resistance when you inhale but create much more resistance when you exhale. That increased resistance creates the positive airway pressure. This positive pressure while you exhale keeps your airway open, making it easier to breath when you inhale again.  Bilevel positive airway pressure (BPAP) device. The BPAP device is used mainly in patients with central sleep apnea. This device is similar to the CPAP device because it also uses an air pump to deliver continuous air pressure through a mask. However, with the BPAP machine, the pressure is set at two different levels. The pressure when you exhale is lower than the pressure when you inhale.  Surgery. Typically, surgery is only done if you cannot comply with less invasive treatments or if the less invasive treatments do not improve your condition. Surgery involves removing excess tissue in your airway to create a wider passage way. Document Released: 03/14/2002 Document Revised: 07/19/2012 Document Reviewed: 07/31/2011 [facility] Patient Information 2015 [place] [employer]. This information is not intended to replace advice given to you by your health care provider. Make sure you discuss any questions you have with your health care provider.     Your A1C is a measure of your sugar over the past 3 months and is not affected by what you have eaten over the past few days. Diabetes increases your chances of stroke and heart attack over 300 % and is the leading cause of blindness and kidney failure in the [place]. Please make sure you decrease bad carbs like white bread, white rice, potatoes, corn, soft drinks, pasta, cereals, refined sugars, sweet tea, dried fruits, and fruit juice. Good carbs are okay to eat in moderation like sweet potatoes, brown rice, whole grain pasta/bread, most fruit (except dried fruit) and you can eat as many veggies as you want.   Greater than 6.5 is considered diabetic. Between 6.4  and 5.7 is prediabetic If your A1C is less than 5.7 you are NOT diabetic.  Your A1C was 6.1   Diabetes is a very complicated disease...lets simplify it.  An easy way to look at it to understand the complications is if you think of the extra sugar floating in your blood stream as glass shards floating through your blood stream.    Diabetes affects your small vessels first: 1) The glass shards (sugar) scraps down the tiny blood vessels in your eyes and lead to diabetic retinopathy, the leading cause of blindness in the [place]. Diabetes is the leading cause of newly diagnosed adult (9620 to 60 years of age) blindness in the [place].  2) The glass shards scratches down the tiny vessels of your legs leading to nerve damage called neuropathy and can lead to amputations of your feet. More than 60% of all non-traumatic amputations of lower limbs occur in people with diabetes.  3) Over time the small vessels in your brain are shredded and closed off, individually this does not cause any problems but over a long period of time many of the small vessels being blocked can lead to Vascular Dementia.   4) Your kidney's are a filter system and have a "net" that keeps certain things in the body and lets bad things out. Sugar shreds this net and leads to kidney damage and eventually failure. Decreasing the sugar that is destroying the net and certain blood pressure medications  can help stop or decrease progression of kidney disease. Diabetes was the primary cause of kidney failure in 44 percent of all new cases in 2011.  5) Diabetes also destroys the small vessels in your penis that lead to erectile dysfunction. Eventually the vessels are so damaged that you may not be responsive to cialis or viagra.   Diabetes and your large vessels: Your larger vessels consist of your coronary arteries in your heart and the carotid vessels to your brain. Diabetes or even increased sugars put you at 300% increased risk of  heart attack and stroke and this is why.. The sugar scrapes down your large blood vessels and your body sees this as an internal injury and tries to repair itself. Just like you get a scab on your skin, your platelets will stick to the blood vessel wall trying to heal it. This is why we have diabetics on low dose aspirin daily, this prevents the platelets from sticking and can prevent plaque formation. In addition, your body takes cholesterol and tries to shove it into the open wound. This is why we want your LDL, or bad cholesterol, below 70.   The combination of platelets and cholesterol over 5-10 years forms plaque that can break off and cause a heart attack or stroke.   PLEASE REMEMBER:  Diabetes is preventable! Up to 85 percent of complications and morbidities among individuals with type 2 diabetes can be prevented, delayed, or effectively treated and minimized with regular visits to a health professional, appropriate monitoring and medication, and a healthy diet and lifestyle.     Bad carbs also include fruit juice, alcohol, and sweet tea. These are empty calories that do not signal to your brain that you are full.   Please remember the good carbs are still carbs which convert into sugar. So please measure them out no more than 1/2-1 cup of rice, oatmeal, pasta, and beans  Veggies are however free foods! Pile them on.   Not all fruit is created equal. Please see the list below, the fruit at the bottom is higher in sugars than the fruit at the top. Please avoid all dried fruits.

## 2015-06-21 ENCOUNTER — Other Ambulatory Visit: Payer: Self-pay | Admitting: Internal Medicine

## 2015-07-13 ENCOUNTER — Telehealth: Payer: Self-pay | Admitting: *Deleted

## 2015-07-13 MED ORDER — PREDNISONE 20 MG PO TABS: ORAL_TABLET | ORAL | Status: DC

## 2015-07-13 MED ORDER — AZITHROMYCIN 250 MG PO TABS: ORAL_TABLET | ORAL | Status: DC

## 2015-07-13 NOTE — Telephone Encounter (Signed)
Patient called and states he has head congestion and fever x 3 days,  Per Dr Oneta RackMcKeown, send in RX's for a Z-pak and Prednisone.  Spouse is aware.

## 2015-07-18 ENCOUNTER — Encounter: Payer: Self-pay | Admitting: Internal Medicine

## 2015-07-18 ENCOUNTER — Ambulatory Visit (INDEPENDENT_AMBULATORY_CARE_PROVIDER_SITE_OTHER): Payer: BLUE CROSS/BLUE SHIELD | Admitting: Internal Medicine

## 2015-07-18 VITALS — BP 122/82 | HR 68 | Temp 97.8°F | Resp 16 | Ht 68.75 in | Wt 206.6 lb

## 2015-07-18 DIAGNOSIS — I10 Essential (primary) hypertension: Secondary | ICD-10-CM

## 2015-07-18 MED ORDER — LOSARTAN POTASSIUM 50 MG PO TABS: ORAL_TABLET | ORAL | Status: DC

## 2015-07-18 MED ORDER — ATENOLOL 50 MG PO TABS
ORAL_TABLET | ORAL | Status: DC
Start: 2015-07-18 — End: 2016-01-13

## 2015-07-18 NOTE — Progress Notes (Signed)
  Subjective:    Patient ID: Wesley Oneal, male    DOB: 1955/06/11, 60 y.o.   MRN: [id number]  HPI  This very nice 60 yo [name] returns for early f/u of recently dx'd HTN and had some c/o postural dizziness. Note that his [place] [facility] Dz after a very short 1 week illness.  CV ROS is negative. He has developed a dry hacking type tickle cough since on Lisinopril.   Medication Sig  . Cholecalciferol (VITAMIN D PO) Take 5,000 Units by mouth daily.  . naproxen sodium (ANAPROX) 220 MG tablet Take 220 mg by mouth 2 (two) times daily as needed.  . Omeprazole Magnesium (PRILOSEC OTC PO) Take 20 mg by mouth daily.  Marland Kitchen. terbinafine (LAMISIL) 250 MG tablet TAKE 1 TABLET BY MOUTH EVERY DAY  . bisoprolol-hydrochlorothiazide (ZIAC) 5-6.25 MG tablet Take 1 tablet every morning for BP  . lisinopril (PRINIVIL,ZESTRIL) 20 MG tablet Take 1 tablet every night for BP   Allergies  Allergen Reactions  . Lisinopril Cough  . Penicillins    Review of Systems 10 point systems review negative except as above.     Objective:   Physical Exam  BP 122/82 mmHg  Pulse 68  Temp(Src) 97.8 F (36.6 C)  Resp 16  Ht 5' 8.75" (1.746 m)  Wt 206 lb 9.6 oz (93.713 kg)  BMI 30.74 kg/m2  In no distress. Skin clear.   HEENT - Eac's patent. TM's Nl. EOM's full. PERRLA. NasoOroPharynx clear. Neck - supple. Nl Thyroid. Carotids 2+ & No bruits, nodes, JVD Chest - Clear equal BS w/o Rales, rhonchi, wheezes. Cor - Nl HS. RRR w/o sig MGR. PP 1(+). No edema. MS- FROM w/o deformities. Muscle power, tone and bulk Nl. Gait Nl. Neuro -  Nl w/o focal abnormalities.    Assessment & Plan:   1. Essential hypertension  - d/c lisinopril  - atenolol (TENORMIN) 50 MG tablet; Take 1 tablet every morning for BP  Dispense: 90 tablet; Refill: 1 - losartan (COZAAR) 50 MG tablet; Take 1 tablet at nite for BP  Dispense: 90 tablet; Refill: 1  - continue to monitor BPs 2 x daily

## 2015-10-19 ENCOUNTER — Encounter: Payer: Self-pay | Admitting: Internal Medicine

## 2015-10-19 ENCOUNTER — Ambulatory Visit: Payer: BLUE CROSS/BLUE SHIELD | Admitting: Internal Medicine

## 2015-10-19 VITALS — BP 130/74 | HR 64 | Temp 97.5°F | Resp 16 | Ht 68.75 in | Wt 210.0 lb

## 2015-10-19 DIAGNOSIS — K219 Gastro-esophageal reflux disease without esophagitis: Secondary | ICD-10-CM

## 2015-10-19 DIAGNOSIS — Z79899 Other long term (current) drug therapy: Secondary | ICD-10-CM

## 2015-10-19 DIAGNOSIS — E785 Hyperlipidemia, unspecified: Secondary | ICD-10-CM

## 2015-10-19 DIAGNOSIS — R7303 Prediabetes: Secondary | ICD-10-CM

## 2015-10-19 DIAGNOSIS — I10 Essential (primary) hypertension: Secondary | ICD-10-CM

## 2015-10-19 DIAGNOSIS — E559 Vitamin D deficiency, unspecified: Secondary | ICD-10-CM

## 2015-10-19 LAB — CBC WITH DIFFERENTIAL/PLATELET
Basophils Absolute: 0 {cells}/uL (ref 0–200)
Basophils Relative: 0 %
Eosinophils Absolute: 90 {cells}/uL (ref 15–500)
Eosinophils Relative: 2 %
HCT: 46.8 % (ref 38.5–50.0)
Hemoglobin: 16 g/dL (ref 13.2–17.1)
Lymphocytes Relative: 33 %
Lymphs Abs: 1485 {cells}/uL (ref 850–3900)
MCH: 31.5 pg (ref 27.0–33.0)
MCHC: 34.2 g/dL (ref 32.0–36.0)
MCV: 92.1 fL (ref 80.0–100.0)
MPV: 10.5 fL (ref 7.5–12.5)
Monocytes Absolute: 450 {cells}/uL (ref 200–950)
Monocytes Relative: 10 %
Neutro Abs: 2475 {cells}/uL (ref 1500–7800)
Neutrophils Relative %: 55 %
Platelets: 182 K/uL (ref 140–400)
RBC: 5.08 MIL/uL (ref 4.20–5.80)
RDW: 14.1 % (ref 11.0–15.0)
WBC: 4.5 K/uL (ref 3.8–10.8)

## 2015-10-19 LAB — HEMOGLOBIN A1C
HEMOGLOBIN A1C: 5.5 % (ref <5.7)
MEAN PLASMA GLUCOSE: 111 mg/dL

## 2015-10-19 LAB — TSH: TSH: 1.23 m[IU]/L (ref 0.40–4.50)

## 2015-10-19 NOTE — Progress Notes (Signed)
Patient ID: Wesley Oneal, male   DOB: 08-04-1955, 60 y.o.   MRN: [id number]  [facility] ADULT & ADOLESCENT INTERNAL MEDICINE                       Wesley Oneal, M.D.        Wesley Oneal, P.A.-C       Terri Piedra, P.A.-C   [facility]                [address]                [place], [place]. [address] Telephone [phone] Telefax [phone] ______________________________________________________________________     This very nice 60 y.o. [name] presents for 3 month follow up with Hypertension, Hyperlipidemia, Pre-Diabetes and Vitamin D Deficiency.      Patient is treated for HTN circa Feb 2017 & BP has been controlled at home. Today's BP: 130/74 mmHg. Patient has had no complaints of any cardiac type chest pain, palpitations, dyspnea/orthopnea/PND, dizziness, claudication, or dependent edema.     Hyperlipidemia is not controlled with diet. Last Lipids were not at goal with Cholesterol 180; HDL 46; elevated  LDL 112; Triglycerides 110 on 05/25/2015.     Also, the patient has history of PreDiabetes and has had no symptoms of reactive hypoglycemia, diabetic polys, paresthesias or visual blurring.  Last A1c was not at goal with A1c 6.1% on  05/25/2015.     Further, the patient also has history of Vitamin D Deficiency of "37"  in 2010 and supplements vitamin D without any suspected side-effects. Last vitamin D was 67 on 05/25/2015.    Medication Sig  . atenolol  50 MG tablet Take 1 tablet every morning for BP  . losartan 50 MG  Take 1 tablet at nite for BP  . Omeprazole  OTC Take 20 mg by mouth daily.  . naproxen  220 MG tablet Take 220 mg by mouth 2 (two) times daily as needed. Reported on 10/19/2015   Allergies  Allergen Reactions  . Lisinopril Cough  . Penicillins    PMHx:  HTN, HLD, Vit D Def , PreDM  Immunization History  Administered Date(s) Administered  . PPD Test 05/25/2015  . Td 09/06/2014   FHx:    Reviewed /  unchanged  SHx:    Reviewed / unchanged  Systems Review:  Constitutional: Denies fever, chills, wt changes, headaches, insomnia, fatigue, night sweats, change in appetite. Eyes: Denies redness, blurred vision, diplopia, discharge, itchy, watery eyes.  ENT: Denies discharge, congestion, post nasal drip, epistaxis, sore throat, earache, hearing loss, dental pain, tinnitus, vertigo, sinus pain, snoring.  CV: Denies chest pain, palpitations, irregular heartbeat, syncope, dyspnea, diaphoresis, orthopnea, PND, claudication or edema. Respiratory: denies cough, dyspnea, DOE, pleurisy, hoarseness, laryngitis, wheezing.  Gastrointestinal: Denies dysphagia, odynophagia, heartburn, reflux, water brash, abdominal pain or cramps, nausea, vomiting, bloating, diarrhea, constipation, hematemesis, melena, hematochezia  or hemorrhoids. Genitourinary: Denies dysuria, frequency, urgency, nocturia, hesitancy, discharge, hematuria or flank pain. Musculoskeletal: Denies arthralgias, myalgias, stiffness, jt. swelling, pain, limping or strain/sprain.  Skin: Denies pruritus, rash, hives, warts, acne, eczema or change in skin lesion(s). Neuro: No weakness, tremor, incoordination, spasms, paresthesia or pain. Psychiatric: Denies confusion, memory loss or sensory loss. Endo: Denies change in weight, skin or hair change.  Heme/Lymph: No excessive bleeding, bruising or enlarged lymph nodes.  Physical Exam  BP 130/74   Pulse 64  Temp 97.5 F Resp 16  Ht 5' 8.75"  Wt 210 lb    BMI 31.25   SpO2 97%  Appears over  nourished and in no distress.  Eyes: PERRLA, EOMs, conjunctiva no swelling or erythema. Sinuses: No frontal/maxillary tenderness ENT/Mouth: EAC's clear, TM's nl w/o erythema, bulging. Nares clear w/o erythema, swelling, exudates. Oropharynx clear without erythema or exudates. Oral hygiene is good. Tongue normal, non obstructing. Hearing intact.  Neck: Supple. Thyroid nl. Car 2+/2+ without bruits, nodes or  JVD. Chest: Respirations nl with BS clear & equal w/o rales, rhonchi, wheezing or stridor.  Cor: Heart sounds normal w/ regular rate and rhythm without sig. murmurs, gallops, clicks, or rubs. Peripheral pulses normal and equal  without edema.  Abdomen: Soft & bowel sounds normal. Non-tender w/o guarding, rebound, hernias, masses, or organomegaly.  Lymphatics: Unremarkable.  Musculoskeletal: Full ROM all peripheral extremities, joint stability, 5/5 strength, and normal gait.  Skin: Warm, dry without exposed rashes, lesions or ecchymosis apparent.  Neuro: Cranial nerves intact, reflexes equal bilaterally. Sensory-motor testing grossly intact. Tendon reflexes grossly intact.  Pysch: Alert & oriented x 3.  Insight and judgement nl & appropriate. No ideations.  Assessment and Plan:  1. Essential hypertension  - Continue medication, monitor blood pressure at home. Continue DASH diet. Reminder to go to the ER if any CP, SOB, nausea, dizziness, severe HA, changes vision/speech, left arm numbness and tingling and jaw pain. - TSH  2. Hyperlipemia  -  - Continue diet/meds, exercise,& lifestyle modifications. Continue monitor periodic cholesterol/liver & renal functions Lipid panel - TSH  3. Prediabetes  - Continue diet, exercise, lifestyle modifications. Monitor appropriate labs. - Hemoglobin A1c - Insulin, random  4. Vitamin D deficiency  - Continue supplementation. - VITAMIN D 25 Hydroxy   5. Gastroesophageal reflux disease   6. Morbid obesity due to excess calories (HCC)   7. Medication management  - CBC with Differential/Platelet - BASIC METABOLIC PANEL WITH GFR - Hepatic function panel - Magnesium   Recommended regular exercise, BP monitoring, weight control, and discussed med and SE's. Recommended labs to assess and monitor clinical status. Further disposition pending results of labs. Over 30 minutes of exam, counseling, chart review was performed

## 2015-10-19 NOTE — Patient Instructions (Signed)

## 2015-10-20 LAB — BASIC METABOLIC PANEL WITH GFR
BUN: 13 mg/dL (ref 7–25)
CO2: 24 mmol/L (ref 20–31)
Calcium: 9.8 mg/dL (ref 8.6–10.3)
Chloride: 103 mmol/L (ref 98–110)
Creat: 1 mg/dL (ref 0.70–1.25)
GFR, EST NON AFRICAN AMERICAN: 81 mL/min (ref >=60)
GLUCOSE: 94 mg/dL (ref 65–99)
POTASSIUM: 4.6 mmol/L (ref 3.5–5.3)
Sodium: 139 mmol/L (ref 135–146)

## 2015-10-20 LAB — HEPATIC FUNCTION PANEL
ALBUMIN: 4.2 g/dL (ref 3.6–5.1)
ALK PHOS: 48 U/L (ref 40–115)
ALT: 16 U/L (ref 9–46)
AST: 18 U/L (ref 10–35)
BILIRUBIN INDIRECT: 0.4 mg/dL (ref 0.2–1.2)
Bilirubin, Direct: 0.1 mg/dL (ref <=0.2)
TOTAL PROTEIN: 7 g/dL (ref 6.1–8.1)
Total Bilirubin: 0.5 mg/dL (ref 0.2–1.2)

## 2015-10-20 LAB — LIPID PANEL
Cholesterol: 185 mg/dL (ref 125–200)
HDL: 47 mg/dL (ref >=40)
LDL CALC: 96 mg/dL (ref <130)
Total CHOL/HDL Ratio: 3.9 Ratio (ref <=5.0)
Triglycerides: 210 mg/dL — ABNORMAL HIGH (ref <150)
VLDL: 42 mg/dL — AB (ref <30)

## 2015-10-20 LAB — INSULIN, RANDOM: INSULIN: 15.5 u[IU]/mL (ref 2.0–19.6)

## 2015-10-20 LAB — VITAMIN D 25 HYDROXY (VIT D DEFICIENCY, FRACTURES): Vit D, 25-Hydroxy: 65 ng/mL (ref 30–100)

## 2015-10-20 LAB — MAGNESIUM: Magnesium: 1.9 mg/dL (ref 1.5–2.5)

## 2016-01-13 ENCOUNTER — Other Ambulatory Visit: Payer: Self-pay | Admitting: Internal Medicine

## 2016-01-13 DIAGNOSIS — I10 Essential (primary) hypertension: Secondary | ICD-10-CM

## 2016-01-30 ENCOUNTER — Encounter: Payer: Self-pay | Admitting: Internal Medicine

## 2016-01-30 ENCOUNTER — Ambulatory Visit (INDEPENDENT_AMBULATORY_CARE_PROVIDER_SITE_OTHER): Payer: BLUE CROSS/BLUE SHIELD | Admitting: Internal Medicine

## 2016-01-30 VITALS — BP 136/74 | HR 76 | Temp 98.2°F | Resp 16 | Ht 68.75 in | Wt 216.0 lb

## 2016-01-30 DIAGNOSIS — R7303 Prediabetes: Secondary | ICD-10-CM | POA: Diagnosis not present

## 2016-01-30 DIAGNOSIS — K219 Gastro-esophageal reflux disease without esophagitis: Secondary | ICD-10-CM

## 2016-01-30 DIAGNOSIS — I10 Essential (primary) hypertension: Secondary | ICD-10-CM | POA: Diagnosis not present

## 2016-01-30 DIAGNOSIS — E782 Mixed hyperlipidemia: Secondary | ICD-10-CM

## 2016-01-30 DIAGNOSIS — E559 Vitamin D deficiency, unspecified: Secondary | ICD-10-CM | POA: Diagnosis not present

## 2016-01-30 DIAGNOSIS — Z79899 Other long term (current) drug therapy: Secondary | ICD-10-CM | POA: Diagnosis not present

## 2016-01-30 NOTE — Patient Instructions (Signed)
Please call your insurance company about the cologuard test for colon cancer screening.  If they give you the green light call me and I will order.

## 2016-01-30 NOTE — Progress Notes (Signed)
Assessment and Plan:  Hypertension:  -Continue medication -monitor blood pressure at home. -Continue DASH diet -Reminder to go to the ER if any CP, SOB, nausea, dizziness, severe HA, changes vision/speech, left arm numbness and tingling and jaw pain.  Cholesterol - Continue diet and exercise  Prediabetes well controlled -Continue diet and exercise.   Vitamin D Def -continue medications.   GERD -well controlled on omeprazole  Continue diet and meds as discussed. Further disposition pending results of labs. Discussed med's effects and SE's.    HPI 60 y.o. male  presents for 3 month follow up with hypertension, hyperlipidemia, diabetes and vitamin D deficiency.   His blood pressure has been controlled at home, today their BP is BP: 136/74.He does workout. He denies chest pain, shortness of breath, dizziness.   He is on cholesterol medication and denies myalgias. His cholesterol is at goal. The cholesterol was:  10/19/2015: Cholesterol 185; HDL 47; LDL Cholesterol 96; Triglycerides 210   He has been working on diet and exercise for prediabetes without complications, he is on bASA, he is on ACE/ARB, and denies  foot ulcerations, hyperglycemia, hypoglycemia , increased appetite, nausea, paresthesia of the feet, polydipsia, polyuria, visual disturbances, vomiting and weight loss. Last A1C was: 10/19/2015: Hgb A1c MFr Bld 5.5   Patient is on Vitamin D supplement. 10/19/2015: Vit D, 25-Hydroxy 65  His acid reflux has been really well controlled recently on omeprazole.  He is not interested in coming off this medication.  He declines labs today.     Current Medications:  Current Outpatient Prescriptions on File Prior to Visit  Medication Sig Dispense Refill  . atenolol (TENORMIN) 50 MG tablet TAKE 1 TABLET BY MOUTH EVERY MORNING 90 tablet 1  . Cholecalciferol (VITAMIN D PO) Take 5,000 Units by mouth daily.    Marland Kitchen. losartan (COZAAR) 50 MG tablet TAKE 1 TABLET BY MOUTH EVERY EVENING 90 tablet  1  . Omeprazole Magnesium (PRILOSEC OTC PO) Take 20 mg by mouth daily.     No current facility-administered medications on file prior to visit.    Medical History:  Past Medical History:  Diagnosis Date  . Hyperlipidemia   . Hypertension Feb 2017  . Prediabetes Feb 2017   . Vitamin D deficiency disease    Allergies:  Allergies  Allergen Reactions  . Lisinopril Cough  . Penicillins      Review of Systems:  Review of Systems  Constitutional: Negative for chills, fever and malaise/fatigue.  HENT: Negative for congestion, ear pain and sore throat.   Eyes: Negative.   Respiratory: Negative for cough, shortness of breath and wheezing.   Cardiovascular: Negative for chest pain, palpitations and leg swelling.  Gastrointestinal: Negative for abdominal pain, blood in stool, constipation, diarrhea, heartburn and melena.  Genitourinary: Negative.   Skin: Negative.   Neurological: Negative for dizziness, sensory change, loss of consciousness and headaches.  Psychiatric/Behavioral: Negative for depression. The patient is not nervous/anxious and does not have insomnia.     Family history- Review and unchanged  Social history- Review and unchanged  Physical Exam: BP 136/74   Pulse 76   Temp 98.2 F (36.8 C) (Temporal)   Resp 16   Ht 5' 8.75" (1.746 m)   Wt 216 lb (98 kg)   BMI 32.13 kg/m  Wt Readings from Last 3 Encounters:  01/30/16 216 lb (98 kg)  10/19/15 210 lb (95.3 kg)  07/18/15 206 lb 9.6 oz (93.7 kg)   General Appearance: Well nourished well developed, non-toxic appearing, in  no apparent distress. Eyes: PERRLA, EOMs, conjunctiva no swelling or erythema ENT/Mouth: Ear canals clear with no erythema, swelling, or discharge.  TMs normal bilaterally, oropharynx clear, moist, with no exudate.   Neck: Supple, thyroid normal, no JVD, no cervical adenopathy.  Respiratory: Respiratory effort normal, breath sounds clear A&P, no wheeze, rhonchi or rales noted.  No retractions, no  accessory muscle usage Cardio: RRR with no MRGs. No noted edema.  Abdomen: Soft, + BS.  Non tender, no guarding, rebound, hernias, masses. Musculoskeletal: Full ROM, 5/5 strength, Normal gait Skin: Warm, dry without rashes, lesions, ecchymosis.  Neuro: Awake and oriented X 3, Cranial nerves intact. No cerebellar symptoms.  Psych: normal affect, Insight and Judgment appropriate.    Terri Piedra, PA-C 9:12 AM [facility] Adult & Adolescent Internal Medicine

## 2016-05-05 ENCOUNTER — Ambulatory Visit: Payer: Self-pay | Admitting: Internal Medicine

## 2016-06-12 ENCOUNTER — Encounter: Payer: Self-pay | Admitting: Internal Medicine

## 2016-06-24 NOTE — Patient Instructions (Signed)

## 2016-06-24 NOTE — Progress Notes (Signed)
[facility] ADULT & ADOLESCENT INTERNAL MEDICINE   Lucky Cowboy, M.D.    Dyanne Carrel. Steffanie Dunn, P.A.-C      Terri Piedra, P.A.-C  [facility]                [address]                [place], [place]. [address] Telephone [phone] Telefax [phone] Annual  Screening/Preventative Visit  & Comprehensive Evaluation & Examination     This very nice 61 y.o. [name] presents for a Screening/Preventative Visit & comprehensive evaluation and management of multiple medical co-morbidities.  Patient has been followed for HTN, Prediabetes, Hyperlipidemia and Vitamin D Deficiency. Patient also has GERD controlled with diet and meds.     HTN predates since Feb 2017. Patient's BP has been controlled at home.  Today's BP is elevated at 156/96.  Patient denies any cardiac symptoms as chest pain, palpitations, shortness of breath, dizziness or ankle swelling.     Patient's hyperlipidemia is controlled with diet and medications. Patient denies myalgias or other medication SE's. Last lipids were at goal albeit elevated Trig's: Lab Results  Component Value Date   CHOL 176 06/25/2016   HDL 41 06/25/2016   LDLCALC 101 (H) 06/25/2016   TRIG 170 (H) 06/25/2016   CHOLHDL 4.3 06/25/2016      Patient has prediabetes (A1c 6.1% in Feb 2017) and patient denies reactive hypoglycemic symptoms, visual blurring, diabetic polys or paresthesias. Last A1c was at goal:  Lab Results  Component Value Date   HGBA1C 5.5 10/19/2015       Finally, patient has history of Vitamin D Deficiency ("37" in 2010) and last vitamin D was at goal:  Lab Results  Component Value Date   VD25OH 65 10/19/2015   Current Outpatient Prescriptions on File Prior to Visit  Medication Sig  . atenolol  50 MG  TAKE 1 TAB EVERY MORNING  . VITAMIN D 5,000 Units Take  daily.  Marland Kitchen losartan 50 MG  TAKE 1 TAB EVERY EVENING  . Omeprazole / PRILOSEC  Take 20 mg  daily.   Allergies  Allergen Reactions  . Lisinopril  Cough  . Penicillins    Past Medical History:  Diagnosis Date  . Hyperlipidemia   . Hypertension Feb 2017  . Prediabetes Feb 2017   . Vitamin D deficiency disease    Health Maintenance  Topic Date Due  . Hepatitis C Screening  05-01-1955  . HIV Screening  05/21/1970  . COLONOSCOPY  05/21/2005  . INFLUENZA VACCINE  11/06/2015  . TETANUS/TDAP  09/05/2024   Immunization History  Administered Date(s) Administered  . PPD Test 05/25/2015, 06/25/2016  . Td 09/06/2014   No past surgical history on file.   Social History   Social History  . Marital status: Married    Spouse name: N/A  . Number of children: N/A  . Years of education: N/A   Occupational History  . Semi-retired welder    Social History Main Topics  . Smoking status: Former Games developer  . Smokeless tobacco: Former Neurosurgeon    Quit date: 04/07/2001  . Alcohol use 14.4 oz/week    24 Standard drinks or equivalent per week     Comment: 1 case of beer and 2 shots of vodka  . Drug use: Unknown  . Sexual activity: Not on file    ROS Constitutional: Denies fever, chills, weight loss/gain, headaches, insomnia,  night sweats or change in appetite. Does c/o fatigue. Eyes:  Denies redness, blurred vision, diplopia, discharge, itchy or watery eyes.  ENT: Denies discharge, congestion, post nasal drip, epistaxis, sore throat, earache, hearing loss, dental pain, Tinnitus, Vertigo, Sinus pain or snoring.  Cardio: Denies chest pain, palpitations, irregular heartbeat, syncope, dyspnea, diaphoresis, orthopnea, PND, claudication or edema Respiratory: denies cough, dyspnea, DOE, pleurisy, hoarseness, laryngitis or wheezing.  Gastrointestinal: Denies dysphagia, heartburn, reflux, water brash, pain, cramps, nausea, vomiting, bloating, diarrhea, constipation, hematemesis, melena, hematochezia, jaundice or hemorrhoids Genitourinary: Denies dysuria, frequency, urgency, nocturia, hesitancy, discharge, hematuria or flank pain Musculoskeletal: Denies  arthralgia, myalgia, stiffness, Jt. Swelling, pain, limp or strain/sprain. Denies Falls. Skin: Denies puritis, rash, hives, warts, acne, eczema or change in skin lesion Neuro: No weakness, tremor, incoordination, spasms, paresthesia or pain Psychiatric: Denies confusion, memory loss or sensory loss. Denies Depression. Endocrine: Denies change in weight, skin, hair change, nocturia, and paresthesia, diabetic polys, visual blurring or hyper / hypo glycemic episodes.  Heme/Lymph: No excessive bleeding, bruising or enlarged lymph nodes.  Physical Exam  BP (!) 156/96   Pulse 60   Temp 97.4 F (36.3 C)   Resp 16   Ht 5\' 9"  (1.753 m)   Wt 216 lb 12.8 oz (98.3 kg)   BMI 32.02 kg/m   General Appearance: Well nourished, in no apparent distress.  Eyes: PERRLA, EOMs, conjunctiva no swelling or erythema, normal fundi and vessels. Sinuses: No frontal/maxillary tenderness ENT/Mouth: EACs patent / TMs  nl. Nares clear without erythema, swelling, mucoid exudates. Oral hygiene is good. No erythema, swelling, or exudate. Tongue normal, non-obstructing. Tonsils not swollen or erythematous. Hearing normal.  Neck: Supple, thyroid normal. No bruits, nodes or JVD. Respiratory: Respiratory effort normal.  BS equal and clear bilateral without rales, rhonci, wheezing or stridor. Cardio: Heart sounds are normal with regular rate and rhythm and no murmurs, rubs or gallops. Peripheral pulses are normal and equal bilaterally without edema. No aortic or femoral bruits. Chest: symmetric with normal excursions and percussion.  Abdomen: Soft, with Nl bowel sounds. Nontender, no guarding, rebound, hernias, masses, or organomegaly.  Lymphatics: Non tender without lymphadenopathy.  Genitourinary: No hernias.Testes nl. DRE - prostate nl for age - smooth & firm w/o nodules. Musculoskeletal: Full ROM all peripheral extremities, joint stability, 5/5 strength, and normal gait. Skin: Warm and dry without rashes, lesions,  cyanosis, clubbing or  ecchymosis.  Neuro: Cranial nerves intact, reflexes equal bilaterally. Normal muscle tone, no cerebellar symptoms. Sensation intact.  Pysch: Alert and oriented X 3 with normal affect, insight and judgment appropriate.   Assessment and Plan  1. Annual Preventative/Screening Exam    2. Essential hypertension  - EKG 12-Lead - US, RETROPERITNL ABD,  LTD - Urinalysis, Routine w reflex microscopic - Microalbumin / creatinine urine ratio - CBC with Differential/Platelet - BASIC METABOLIC PANEL WITH GFR - Magnesium - TSH  3. Mixed hyperlipidemia  - EKG 12-Lead - US, RETROPERITNL ABD,  LTD - Hepatic function panel - Lipid panel - TSH  4. Prediabetes  - EKG 12-Lead - US, RETROPERITNL ABD,  LTD - Hemoglobin A1c - Insulin, random  5. Vitamin D deficiency  - VITAMIN D 25 Hydroxy   6. Gastroesophageal reflux disease   7. Screening for rectal cancer  - POC Hemoccult Bld/Stl   8. Prostate cancer screening  - PSA  9. Screening examination for pulmonary tuberculosis  - PPD  10. Screening for ischemic heart disease  - EKG 12-Lead  11. Screening for AAA (aortic abdominal aneurysm)  - US, RETROPERITNL ABD,  LTD  12. Fatigue, unspecified  type  - Vitamin B12 - Iron and TIBC - Testosterone - CBC with Differential/Platelet  13. Medication management  - Urinalysis, Routine w reflex microscopic - CBC with Differential/Platelet - BASIC METABOLIC PANEL WITH GFR - Hepatic function panel - Magnesium - Lipid panel - TSH - Hemoglobin A1c - VITAMIN D 25 Hydroxy        Continue prudent diet as discussed, weight control, BP monitoring, regular exercise, and medications as discussed.  Discussed med effects and SE's. Routine screening labs and tests as requested with regular follow-up as recommended. Over 40 minutes of exam, counseling, chart review and high complex critical decision making was performed

## 2016-06-25 ENCOUNTER — Encounter: Payer: Self-pay | Admitting: Internal Medicine

## 2016-06-25 ENCOUNTER — Ambulatory Visit (INDEPENDENT_AMBULATORY_CARE_PROVIDER_SITE_OTHER): Payer: BLUE CROSS/BLUE SHIELD | Admitting: Internal Medicine

## 2016-06-25 VITALS — BP 156/96 | HR 60 | Temp 97.4°F | Resp 16 | Ht 69.0 in | Wt 216.8 lb

## 2016-06-25 DIAGNOSIS — Z79899 Other long term (current) drug therapy: Secondary | ICD-10-CM

## 2016-06-25 DIAGNOSIS — Z111 Encounter for screening for respiratory tuberculosis: Secondary | ICD-10-CM

## 2016-06-25 DIAGNOSIS — Z136 Encounter for screening for cardiovascular disorders: Secondary | ICD-10-CM | POA: Diagnosis not present

## 2016-06-25 DIAGNOSIS — Z0000 Encounter for general adult medical examination without abnormal findings: Secondary | ICD-10-CM

## 2016-06-25 DIAGNOSIS — I10 Essential (primary) hypertension: Secondary | ICD-10-CM | POA: Diagnosis not present

## 2016-06-25 DIAGNOSIS — R5383 Other fatigue: Secondary | ICD-10-CM

## 2016-06-25 DIAGNOSIS — R7303 Prediabetes: Secondary | ICD-10-CM

## 2016-06-25 DIAGNOSIS — Z1212 Encounter for screening for malignant neoplasm of rectum: Secondary | ICD-10-CM

## 2016-06-25 DIAGNOSIS — E782 Mixed hyperlipidemia: Secondary | ICD-10-CM

## 2016-06-25 DIAGNOSIS — Z125 Encounter for screening for malignant neoplasm of prostate: Secondary | ICD-10-CM

## 2016-06-25 DIAGNOSIS — E559 Vitamin D deficiency, unspecified: Secondary | ICD-10-CM

## 2016-06-25 DIAGNOSIS — Z0001 Encounter for general adult medical examination with abnormal findings: Secondary | ICD-10-CM

## 2016-06-25 DIAGNOSIS — K219 Gastro-esophageal reflux disease without esophagitis: Secondary | ICD-10-CM

## 2016-06-25 LAB — CBC WITH DIFFERENTIAL/PLATELET
Basophils Absolute: 53 cells/uL (ref 0–200)
Basophils Relative: 1 %
Eosinophils Absolute: 106 cells/uL (ref 15–500)
Eosinophils Relative: 2 %
HCT: 49.2 % (ref 38.5–50.0)
HEMOGLOBIN: 16.8 g/dL (ref 13.2–17.1)
LYMPHS ABS: 1961 {cells}/uL (ref 850–3900)
Lymphocytes Relative: 37 %
MCH: 30.6 pg (ref 27.0–33.0)
MCHC: 34.1 g/dL (ref 32.0–36.0)
MCV: 89.6 fL (ref 80.0–100.0)
MONOS PCT: 11 %
MPV: 10.5 fL (ref 7.5–12.5)
Monocytes Absolute: 583 cells/uL (ref 200–950)
NEUTROS ABS: 2597 {cells}/uL (ref 1500–7800)
NEUTROS PCT: 49 %
PLATELETS: 196 10*3/uL (ref 140–400)
RBC: 5.49 MIL/uL (ref 4.20–5.80)
RDW: 13.3 % (ref 11.0–15.0)
WBC: 5.3 10*3/uL (ref 3.8–10.8)

## 2016-06-25 LAB — IRON AND TIBC
%SAT: 45 % (ref 15–60)
Iron: 121 ug/dL (ref 50–180)
TIBC: 269 ug/dL (ref 250–425)
UIBC: 148 ug/dL (ref 125–400)

## 2016-06-25 LAB — BASIC METABOLIC PANEL WITH GFR
BUN: 17 mg/dL (ref 7–25)
CO2: 29 mmol/L (ref 20–31)
CREATININE: 1.23 mg/dL (ref 0.70–1.25)
Calcium: 9.5 mg/dL (ref 8.6–10.3)
Chloride: 100 mmol/L (ref 98–110)
GFR, EST AFRICAN AMERICAN: 73 mL/min (ref >=60)
GFR, EST NON AFRICAN AMERICAN: 63 mL/min (ref >=60)
Glucose, Bld: 95 mg/dL (ref 65–99)
Potassium: 4.6 mmol/L (ref 3.5–5.3)
SODIUM: 137 mmol/L (ref 135–146)

## 2016-06-25 LAB — TSH: TSH: 1.29 mIU/L (ref 0.40–4.50)

## 2016-06-25 LAB — HEPATIC FUNCTION PANEL
ALT: 20 U/L (ref 9–46)
AST: 18 U/L (ref 10–35)
Albumin: 4.6 g/dL (ref 3.6–5.1)
Alkaline Phosphatase: 42 U/L (ref 40–115)
BILIRUBIN DIRECT: 0.2 mg/dL (ref <=0.2)
BILIRUBIN INDIRECT: 0.6 mg/dL (ref 0.2–1.2)
BILIRUBIN TOTAL: 0.8 mg/dL (ref 0.2–1.2)
Total Protein: 7.1 g/dL (ref 6.1–8.1)

## 2016-06-25 LAB — MICROALBUMIN / CREATININE URINE RATIO
Creatinine, Urine: 217 mg/dL (ref 20–370)
MICROALB UR: 0.6 mg/dL
MICROALB/CREAT RATIO: 3 ug/mg{creat} (ref <30)

## 2016-06-25 LAB — LIPID PANEL
CHOL/HDL RATIO: 4.3 ratio (ref <5.0)
Cholesterol: 176 mg/dL (ref <200)
HDL: 41 mg/dL (ref >40)
LDL Cholesterol: 101 mg/dL — ABNORMAL HIGH (ref <100)
TRIGLYCERIDES: 170 mg/dL — AB (ref <150)
VLDL: 34 mg/dL — AB (ref <30)

## 2016-06-25 LAB — VITAMIN B12: Vitamin B-12: 410 pg/mL (ref 200–1100)

## 2016-06-25 LAB — MAGNESIUM: MAGNESIUM: 1.9 mg/dL (ref 1.5–2.5)

## 2016-06-25 LAB — TESTOSTERONE: Testosterone: 429 ng/dL (ref 250–827)

## 2016-06-25 LAB — PSA: PSA: 0.8 ng/mL (ref <=4.0)

## 2016-06-26 LAB — URINALYSIS, ROUTINE W REFLEX MICROSCOPIC
Bilirubin Urine: NEGATIVE
Glucose, UA: NEGATIVE
HGB URINE DIPSTICK: NEGATIVE
Ketones, ur: NEGATIVE
LEUKOCYTES UA: NEGATIVE
NITRITE: NEGATIVE
PROTEIN: NEGATIVE
Specific Gravity, Urine: 1.024 (ref 1.001–1.035)
pH: 6.5 (ref 5.0–8.0)

## 2016-06-26 LAB — INSULIN, RANDOM: INSULIN: 14.6 u[IU]/mL (ref 2.0–19.6)

## 2016-06-26 LAB — HEMOGLOBIN A1C
Hgb A1c MFr Bld: 5.6 % (ref <5.7)
MEAN PLASMA GLUCOSE: 114 mg/dL

## 2016-06-26 LAB — VITAMIN D 25 HYDROXY (VIT D DEFICIENCY, FRACTURES): VIT D 25 HYDROXY: 86 ng/mL (ref 30–100)

## 2016-06-27 LAB — TB SKIN TEST
INDURATION: 0 mm
TB Skin Test: NEGATIVE

## 2016-07-11 ENCOUNTER — Other Ambulatory Visit: Payer: Self-pay | Admitting: *Deleted

## 2016-07-11 DIAGNOSIS — I10 Essential (primary) hypertension: Secondary | ICD-10-CM

## 2016-07-11 MED ORDER — ATENOLOL 50 MG PO TABS: 50.0000 mg | ORAL_TABLET | Freq: Every morning | ORAL | 1 refills | Status: DC

## 2016-09-02 ENCOUNTER — Other Ambulatory Visit: Payer: Self-pay | Admitting: Internal Medicine

## 2016-09-02 DIAGNOSIS — I10 Essential (primary) hypertension: Secondary | ICD-10-CM

## 2016-11-25 ENCOUNTER — Other Ambulatory Visit: Payer: Self-pay | Admitting: Internal Medicine

## 2016-11-25 DIAGNOSIS — I10 Essential (primary) hypertension: Secondary | ICD-10-CM

## 2016-12-04 ENCOUNTER — Ambulatory Visit: Payer: Self-pay | Admitting: Internal Medicine

## 2016-12-08 NOTE — Patient Instructions (Signed)

## 2016-12-08 NOTE — Progress Notes (Signed)
This very nice 61 y.o. [name] presents for 6 month follow up with Hypertension, Hyperlipidemia, Pre-Diabetes and Vitamin D Deficiency. Patient's GERD is controlled w/diet & meds.      Patient is treated for HTN (05/2015) & BP has been controlled at home. Today's BP is not at goal at 176/94 . Patient has had no complaints of any cardiac type chest pain, palpitations, dyspnea/orthopnea/PND, dizziness, claudication, or dependent edema.     Hyperlipidemia is near controlled with diet. Last Lipids were near goal albeit elevated Trig's: Lab Results  Component Value Date   CHOL 176 06/25/2016   HDL 41 06/25/2016   LDLCALC 101 (H) 06/25/2016   TRIG 170 (H) 06/25/2016   CHOLHDL 4.3 06/25/2016      Also, the patient has history of PreDiabetes  Circa Feb 2017 wilt A1c 6.1% and has had no symptoms of reactive hypoglycemia, diabetic polys, paresthesias or visual blurring.  Last A1c was at goal:  Lab Results  Component Value Date   HGBA1C 5.6 06/25/2016      Further, the patient also has history of Vitamin D Deficiency and supplements vitamin D without any suspected side-effects. Last vitamin D was at goal: Lab Results  Component Value Date   VD25OH 86 06/25/2016   Current Outpatient Prescriptions on File Prior to Visit  Medication Sig  . atenolol (TENORMIN) 50 MG tablet TAKE 1 TABLET(50 MG) BY MOUTH EVERY MORNING  . Cholecalciferol (VITAMIN D PO) Take 5,000 Units by mouth daily.  Marland Kitchen losartan (COZAAR) 50 MG tablet TAKE 1 TABLET BY MOUTH EVERY EVENING  . Omeprazole Magnesium (PRILOSEC OTC PO) Take 20 mg by mouth daily.   No current facility-administered medications on file prior to visit.    Allergies  Allergen Reactions  . Lisinopril Cough  . Penicillins    PMHx:   Past Medical History:  Diagnosis Date  . Hyperlipidemia   . Hypertension Feb 2017  . Prediabetes Feb 2017   . Vitamin D deficiency disease    Immunization History  Administered Date(s) Administered  . PPD Test  05/25/2015, 06/25/2016  . Td 09/06/2014   FHx:    Reviewed / unchanged  SHx:    Reviewed / unchanged  Systems Review:  Constitutional: Denies fever, chills, wt changes, headaches, insomnia, fatigue, night sweats, change in appetite. Eyes: Denies redness, blurred vision, diplopia, discharge, itchy, watery eyes.  ENT: Denies discharge, congestion, post nasal drip, epistaxis, sore throat, earache, hearing loss, dental pain, tinnitus, vertigo, sinus pain, snoring.  CV: Denies chest pain, palpitations, irregular heartbeat, syncope, dyspnea, diaphoresis, orthopnea, PND, claudication or edema. Respiratory: denies cough, dyspnea, DOE, pleurisy, hoarseness, laryngitis, wheezing.  Gastrointestinal: Denies dysphagia, odynophagia, heartburn, reflux, water brash, abdominal pain or cramps, nausea, vomiting, bloating, diarrhea, constipation, hematemesis, melena, hematochezia  or hemorrhoids. Genitourinary: Denies dysuria, frequency, urgency, nocturia, hesitancy, discharge, hematuria or flank pain. Musculoskeletal: Denies arthralgias, myalgias, stiffness, jt. swelling, pain, limping or strain/sprain.  Skin: Denies pruritus, rash, hives, warts, acne, eczema or change in skin lesion(s). Neuro: No weakness, tremor, incoordination, spasms, paresthesia or pain. Psychiatric: Denies confusion, memory loss or sensory loss. Endo: Denies change in weight, skin or hair change.  Heme/Lymph: No excessive bleeding, bruising or enlarged lymph nodes.  Physical Exam  BP (!) 176/94   Pulse 72   Temp (!) 97.3 F (36.3 C)   Resp 16   Ht 5\' 9"  (1.753 m)   Wt 213 lb 6.4 oz (96.8 kg)   BMI 31.51 kg/m   Appears well nourished,  well groomed  and in no distress.  Eyes: PERRLA, EOMs, conjunctiva no swelling or erythema. Sinuses: No frontal/maxillary tenderness ENT/Mouth: EAC's clear, TM's nl w/o erythema, bulging. Nares clear w/o erythema, swelling, exudates. Oropharynx clear without erythema or exudates. Oral hygiene is  good. Tongue normal, non obstructing. Hearing intact.  Neck: Supple. Thyroid nl. Car 2+/2+ without bruits, nodes or JVD. Chest: Respirations nl with BS clear & equal w/o rales, rhonchi, wheezing or stridor.  Cor: Heart sounds normal w/ regular rate and rhythm without sig. murmurs, gallops, clicks or rubs. Peripheral pulses normal and equal  without edema.  Abdomen: Soft & bowel sounds normal. Non-tender w/o guarding, rebound, hernias, masses or organomegaly.  Lymphatics: Unremarkable.  Musculoskeletal: Full ROM all peripheral extremities, joint stability, 5/5 strength and normal gait.  Skin: Warm, dry without exposed rashes, lesions or ecchymosis apparent.  Neuro: Cranial nerves intact, reflexes equal bilaterally. Sensory-motor testing grossly intact. Tendon reflexes grossly intact.  Pysch: Alert & oriented x 3.  Insight and judgement nl & appropriate. No ideations.  Assessment and Plan:  1. Essential hypertension  - Continue medication, monitor blood pressure at home.  - Continue DASH diet. Reminder to go to the ER if any CP,  SOB, nausea, dizziness, severe HA, changes vision/speech.  - CBC with Differential/Platelet - BASIC METABOLIC PANEL WITH GFR - Magnesium - TSH - atenolol (TENORMIN) 100 MG tablet; Take 1 tablet every morning for BP  Dispense: 90 tablet; Refill: 1  2. Hyperlipidemia, mixed  - Continue diet/meds, exercise,& lifestyle modifications.  - Continue monitor periodic cholesterol/liver & renal functions   - Hepatic function panel - Lipid panel - TSH  3. Prediabetes  - Continue diet, exercise, lifestyle modifications.  - Monitor appropriate labs.  - Hemoglobin A1c - Insulin, fasting  4. Vitamin D deficiency  - Continue supplementation.  - VITAMIN D 25 Hydroxy   5. Gastroesophageal reflux disease  - CBC with Differential/Platelet  6. Medication management  - CBC with Differential/Platelet - BASIC METABOLIC PANEL WITH GFR - Hepatic function panel -  Magnesium - Lipid panel - TSH - Hemoglobin A1c - Insulin, fasting - VITAMIN D 25 Hydroxy        Discussed  regular exercise, BP monitoring, weight control to achieve/maintain BMI less than 25 and discussed med and SE's. Recommended labs to assess and monitor clinical status with further disposition pending results of labs. Over 30 minutes of exam, counseling, chart review was performed.

## 2016-12-09 ENCOUNTER — Ambulatory Visit (INDEPENDENT_AMBULATORY_CARE_PROVIDER_SITE_OTHER): Payer: BLUE CROSS/BLUE SHIELD | Admitting: Internal Medicine

## 2016-12-09 ENCOUNTER — Encounter: Payer: Self-pay | Admitting: Internal Medicine

## 2016-12-09 VITALS — BP 176/94 | HR 72 | Temp 97.3°F | Resp 16 | Ht 69.0 in | Wt 213.4 lb

## 2016-12-09 DIAGNOSIS — R7303 Prediabetes: Secondary | ICD-10-CM | POA: Diagnosis not present

## 2016-12-09 DIAGNOSIS — E782 Mixed hyperlipidemia: Secondary | ICD-10-CM

## 2016-12-09 DIAGNOSIS — K219 Gastro-esophageal reflux disease without esophagitis: Secondary | ICD-10-CM

## 2016-12-09 DIAGNOSIS — E559 Vitamin D deficiency, unspecified: Secondary | ICD-10-CM | POA: Diagnosis not present

## 2016-12-09 DIAGNOSIS — I10 Essential (primary) hypertension: Secondary | ICD-10-CM | POA: Diagnosis not present

## 2016-12-09 DIAGNOSIS — Z79899 Other long term (current) drug therapy: Secondary | ICD-10-CM | POA: Diagnosis not present

## 2016-12-09 MED ORDER — ATENOLOL 100 MG PO TABS: ORAL_TABLET | ORAL | 1 refills | Status: DC

## 2016-12-10 LAB — INSULIN, FASTING: INSULIN: 11.7 u[IU]/mL (ref 2.0–19.6)

## 2016-12-15 ENCOUNTER — Other Ambulatory Visit: Payer: Self-pay | Admitting: Internal Medicine

## 2016-12-15 LAB — BASIC METABOLIC PANEL WITH GFR
BUN/Creatinine Ratio: 12 (calc) (ref 6–22)
BUN: 15 mg/dL (ref 7–25)
CALCIUM: 9.9 mg/dL (ref 8.6–10.3)
CO2: 22 mmol/L (ref 20–32)
CREATININE: 1.29 mg/dL — AB (ref 0.70–1.25)
Chloride: 104 mmol/L (ref 98–110)
GFR, EST NON AFRICAN AMERICAN: 59 mL/min/{1.73_m2} — AB (ref >=60)
GFR, Est African American: 69 mL/min/{1.73_m2} (ref >=60)
GLUCOSE: 107 mg/dL — AB (ref 65–99)
Potassium: 4.8 mmol/L (ref 3.5–5.3)
Sodium: 141 mmol/L (ref 135–146)

## 2016-12-15 LAB — HEPATIC FUNCTION PANEL
AG RATIO: 1.7 (calc) (ref 1.0–2.5)
ALBUMIN MSPROF: 4.4 g/dL (ref 3.6–5.1)
ALKALINE PHOSPHATASE (APISO): 44 U/L (ref 40–115)
ALT: 17 U/L (ref 9–46)
AST: 16 U/L (ref 10–35)
BILIRUBIN TOTAL: 0.4 mg/dL (ref 0.2–1.2)
Bilirubin, Direct: 0.1 mg/dL (ref 0.0–0.2)
GLOBULIN: 2.6 g/dL (ref 1.9–3.7)
Indirect Bilirubin: 0.3 mg/dL (calc) (ref 0.2–1.2)
TOTAL PROTEIN: 7 g/dL (ref 6.1–8.1)

## 2016-12-15 LAB — TSH: TSH: 1.18 mIU/L (ref 0.40–4.50)

## 2016-12-15 LAB — HEMOGLOBIN A1C
Hgb A1c MFr Bld: 5.7 % of total Hgb — ABNORMAL HIGH (ref <5.7)
Mean Plasma Glucose: 117 (calc)
eAG (mmol/L): 6.5 (calc)

## 2016-12-15 LAB — LIPID PANEL
CHOL/HDL RATIO: 4.2 (calc) (ref <5.0)
CHOLESTEROL: 188 mg/dL (ref <200)
HDL: 45 mg/dL (ref >40)
LDL CHOLESTEROL (CALC): 113 mg/dL — AB
Non-HDL Cholesterol (Calc): 143 mg/dL (calc) — ABNORMAL HIGH (ref <130)
Triglycerides: 182 mg/dL — ABNORMAL HIGH (ref <150)

## 2016-12-15 LAB — CBC WITH DIFFERENTIAL/PLATELET

## 2016-12-15 LAB — MAGNESIUM: Magnesium: 1.9 mg/dL (ref 1.5–2.5)

## 2016-12-15 LAB — TIQ-NTM

## 2016-12-15 LAB — VITAMIN D 25 HYDROXY (VIT D DEFICIENCY, FRACTURES): Vit D, 25-Hydroxy: 62 ng/mL (ref 30–100)

## 2017-03-01 ENCOUNTER — Other Ambulatory Visit: Payer: Self-pay | Admitting: Internal Medicine

## 2017-03-01 DIAGNOSIS — I10 Essential (primary) hypertension: Secondary | ICD-10-CM

## 2017-03-23 NOTE — Progress Notes (Signed)
FOLLOW UP  Assessment and Plan:   Hypertension Well controlled with current medications  Monitor blood pressure at home; patient to call if consistently greater than 130/80 Continue DASH diet.   Reminder to go to the ER if any CP, SOB, nausea, dizziness, severe HA, changes vision/speech, left arm numbness and tingling and jaw pain.  Cholesterol Currently managed by lifestyle with borderline control  Continue low cholesterol diet and exercise.  Check lipid panel.   Prediabetes Discussed disease and risks Discussed diet/exercise, weight management  A1C  Obesity with co morbidities Long discussion about weight loss, diet, and exercise Discussed ideal weight for height and initial weight goal  Patient will work on cutting out soda/sweet tea, white bread Will follow up in 4 months  GERD Symptoms well managed without breakthrough Will try to get off PPI given info for taper and zantac sent in  Vitamin D Def/ osteoporosis prevention Continue supplementation Check Vit D level  Continue diet and meds as discussed. Further disposition pending results of labs. Discussed med's effects and SE's.   Over 30 minutes of exam, counseling, chart review, and critical decision making was performed.   Future Appointments  Date Time Provider Department Center  07/21/2017  9:00 AM Lucky CowboyMcKeown, William, MD [id number] None    ----------------------------------------------------------------------------------------------------------------------  HPI 61 y.o. male  presents for 3 month follow up on hypertension, cholesterol, prediabetes, obesity, GERD and vitamin D deficiency.   BMI is Body mass index is 32.05 kg/m., he has not been working on diet and exercise. Wt Readings from Last 3 Encounters:  03/24/17 217 lb (98.4 kg)  12/09/16 213 lb 6.4 oz (96.8 kg)  06/25/16 216 lb 12.8 oz (98.3 kg)   he has a diagnosis of GERD which is currently managed by prilosec 20 mg daily he reports symptoms is  currently well controlled, and denies breakthrough reflux, burning in chest, hoarseness or cough.    His blood pressure has not been controlled at home, today their BP is BP: 140/82  He does not workout. He denies chest pain, shortness of breath, dizziness.   He is not on cholesterol medication and denies myalgias. His cholesterol is not at goal. The cholesterol last visit was:   Lab Results  Component Value Date   CHOL 188 12/09/2016   HDL 45 12/09/2016   LDLCALC 101 (H) 06/25/2016   TRIG 182 (H) 12/09/2016   CHOLHDL 4.2 12/09/2016    He has not been working on diet and exercise for prediabetes, and denies increased appetite, nausea, paresthesia of the feet, polydipsia, polyuria, visual disturbances and vomiting. Last A1C in the office was:  Lab Results  Component Value Date   HGBA1C 5.7 (H) 12/09/2016   Patient is on Vitamin D supplement and near goal:    Lab Results  Component Value Date   VD25OH 62 12/09/2016        Current Medications:  Current Outpatient Medications on File Prior to Visit  Medication Sig  . atenolol (TENORMIN) 100 MG tablet Take 1 tablet every morning for BP  . Cholecalciferol (VITAMIN D PO) Take 5,000 Units by mouth daily.  Marland Kitchen. losartan (COZAAR) 50 MG tablet TAKE 1 TABLET BY MOUTH EVERY EVENING  . Omeprazole Magnesium (PRILOSEC OTC PO) Take 20 mg by mouth daily.   No current facility-administered medications on file prior to visit.      Allergies:  Allergies  Allergen Reactions  . Lisinopril Cough  . Penicillins      Medical History:  Past Medical History:  Diagnosis Date  . Hyperlipidemia   . Hypertension Feb 2017  . Prediabetes Feb 2017   . Vitamin D deficiency disease    Family history- Reviewed and unchanged Social history- Reviewed and unchanged   Review of Systems:  Review of Systems  Constitutional: Negative for malaise/fatigue and weight loss.  HENT: Negative for hearing loss and tinnitus.   Eyes: Negative for blurred vision  and double vision.  Respiratory: Negative for cough, shortness of breath and wheezing.   Cardiovascular: Negative for chest pain, palpitations, orthopnea, claudication and leg swelling.  Gastrointestinal: Negative for abdominal pain, blood in stool, constipation, diarrhea, heartburn, melena, nausea and vomiting.  Genitourinary: Negative.   Musculoskeletal: Negative for joint pain and myalgias.  Skin: Negative for rash.  Neurological: Negative for dizziness, tingling, sensory change, weakness and headaches.  Endo/Heme/Allergies: Negative for polydipsia.  Psychiatric/Behavioral: Negative.   All other systems reviewed and are negative.     Physical Exam: BP 140/82   Pulse 67   Temp (!) 97.5 F (36.4 C)   Ht 5\' 9"  (1.753 m)   Wt 217 lb (98.4 kg)   SpO2 97%   BMI 32.05 kg/m  Wt Readings from Last 3 Encounters:  03/24/17 217 lb (98.4 kg)  12/09/16 213 lb 6.4 oz (96.8 kg)  06/25/16 216 lb 12.8 oz (98.3 kg)   General Appearance: Well nourished, in no apparent distress. Eyes: PERRLA, EOMs, conjunctiva no swelling or erythema Sinuses: No Frontal/maxillary tenderness ENT/Mouth: Ext aud canals clear, TMs without erythema, bulging. No erythema, swelling, or exudate on post pharynx.  Tonsils not swollen or erythematous. Hearing normal.  Neck: Supple, thyroid normal.  Respiratory: Respiratory effort normal, BS equal bilaterally without rales, rhonchi, wheezing or stridor.  Cardio: RRR with no MRGs. Brisk peripheral pulses without edema.  Abdomen: Soft, + BS.  Non tender, no guarding, rebound, hernias, masses. Lymphatics: Non tender without lymphadenopathy.  Musculoskeletal: Full ROM, 5/5 strength, Normal gait Skin: Warm, dry without rashes, lesions, ecchymosis.  Neuro: Cranial nerves intact. No cerebellar symptoms.  Psych: Awake and oriented X 3, normal affect, Insight and Judgment appropriate.    Dan MakerAshley C Gerrett Loman, NP 9:02 AM [facility]

## 2017-03-24 ENCOUNTER — Ambulatory Visit: Payer: BLUE CROSS/BLUE SHIELD | Admitting: Adult Health

## 2017-03-24 ENCOUNTER — Encounter: Payer: Self-pay | Admitting: Adult Health

## 2017-03-24 VITALS — BP 140/82 | HR 67 | Temp 97.5°F | Ht 69.0 in | Wt 217.0 lb

## 2017-03-24 DIAGNOSIS — I10 Essential (primary) hypertension: Secondary | ICD-10-CM

## 2017-03-24 DIAGNOSIS — E782 Mixed hyperlipidemia: Secondary | ICD-10-CM

## 2017-03-24 DIAGNOSIS — E559 Vitamin D deficiency, unspecified: Secondary | ICD-10-CM

## 2017-03-24 DIAGNOSIS — Z79899 Other long term (current) drug therapy: Secondary | ICD-10-CM

## 2017-03-24 DIAGNOSIS — K219 Gastro-esophageal reflux disease without esophagitis: Secondary | ICD-10-CM

## 2017-03-24 DIAGNOSIS — R7303 Prediabetes: Secondary | ICD-10-CM | POA: Diagnosis not present

## 2017-03-24 DIAGNOSIS — E669 Obesity, unspecified: Secondary | ICD-10-CM | POA: Diagnosis not present

## 2017-03-24 MED ORDER — RANITIDINE HCL 150 MG PO TABS: 150.0000 mg | ORAL_TABLET | Freq: Two times a day (BID) | ORAL | 1 refills | Status: DC

## 2017-03-24 NOTE — Patient Instructions (Signed)
GETTING OFF OF PPI's    Nexium/protonix/prilosec/Omeprazole/Dexilant/Aciphex are called PPI's, they are great at healing your stomach but should only be taken for a short period of time.     Recent studies have shown that taken for a long time they  can increase the risk of osteoporosis (weakening of your bones), pneumonia, low magnesium, restless legs, Cdiff (infection that causes diarrhea), DEMENTIA and most recently kidney damage / disease / insufficiency.     Due to this information we want to try to stop the PPI but if you try to stop it abruptly this can cause rebound acid and worsening symptoms.   So this is how we want you to get off the PPI: Generic is always fine!!  - Start taking the nexium/protonix/prilosec/PPI  every other day with  zantac (ranitidine) OR pepcid famotadine 2 x a day for 2-4 weeks - some people stay on this dosage and can not taper off further. Our main goal is to limit the dosage and amount you are taking so if you need to stay on this dose.   - then decrease the PPI to every 3 days while taking the zantac or pepcid 300mg twice a day the other  days for 2-4  Weeks  - then you can try the zantac or pepcid 300mg once at night or up to 2 x day as needed.  - you can continue on this once at night or stop all together  - Avoid alcohol, spicy foods, NSAIDS (aleve, ibuprofen) at this time. See foods below.   +++++++++++++++++++++++++++++++++++++++++++  Food Choices for Gastroesophageal Reflux Disease  When you have gastroesophageal reflux disease (GERD), the foods you eat and your eating habits are very important. Choosing the right foods can help ease the discomfort of GERD. WHAT GENERAL GUIDELINES DO I NEED TO FOLLOW?  Choose fruits, vegetables, whole grains, low-fat dairy products, and low-fat meat, fish, and poultry.  Limit fats such as oils, salad dressings, butter, nuts, and avocado.  Keep a food diary to identify foods that cause symptoms.  Avoid foods  that cause reflux. These may be different for different people.  Eat frequent small meals instead of three large meals each day.  Eat your meals slowly, in a relaxed setting.  Limit fried foods.  Cook foods using methods other than frying.  Avoid drinking alcohol.  Avoid drinking large amounts of liquids with your meals.  Avoid bending over or lying down until 2-3 hours after eating.   WHAT FOODS ARE NOT RECOMMENDED? The following are some foods and drinks that may worsen your symptoms:  Vegetables Tomatoes. Tomato juice. Tomato and spaghetti sauce. Chili peppers. Onion and garlic. Horseradish. Fruits Oranges, grapefruit, and lemon (fruit and juice). Meats High-fat meats, fish, and poultry. This includes hot dogs, ribs, ham, sausage, salami, and bacon. Dairy Whole milk and chocolate milk. Sour cream. Cream. Butter. Ice cream. Cream cheese.  Beverages Coffee and tea, with or without caffeine. Carbonated beverages or energy drinks. Condiments Hot sauce. Barbecue sauce.  Sweets/Desserts Chocolate and cocoa. Donuts. Peppermint and spearmint. Fats and Oils High-fat foods, including French fries and potato chips. Other Vinegar. Strong spices, such as black pepper, white pepper, red pepper, cayenne, curry powder, cloves, ginger, and chili powder.    

## 2017-03-25 LAB — HEPATIC FUNCTION PANEL
AG Ratio: 1.7 (calc) (ref 1.0–2.5)
ALT: 16 U/L (ref 9–46)
AST: 16 U/L (ref 10–35)
Albumin: 4.3 g/dL (ref 3.6–5.1)
Alkaline phosphatase (APISO): 43 U/L (ref 40–115)
BILIRUBIN TOTAL: 0.8 mg/dL (ref 0.2–1.2)
Bilirubin, Direct: 0.1 mg/dL (ref 0.0–0.2)
Globulin: 2.6 g/dL (calc) (ref 1.9–3.7)
Indirect Bilirubin: 0.7 mg/dL (calc) (ref 0.2–1.2)
Total Protein: 6.9 g/dL (ref 6.1–8.1)

## 2017-03-25 LAB — CBC WITH DIFFERENTIAL/PLATELET
BASOS PCT: 0.4 %
Basophils Absolute: 21 cells/uL (ref 0–200)
EOS PCT: 2.1 %
Eosinophils Absolute: 111 cells/uL (ref 15–500)
HEMATOCRIT: 46.4 % (ref 38.5–50.0)
HEMOGLOBIN: 15.7 g/dL (ref 13.2–17.1)
LYMPHS ABS: 1590 {cells}/uL (ref 850–3900)
MCH: 30.3 pg (ref 27.0–33.0)
MCHC: 33.8 g/dL (ref 32.0–36.0)
MCV: 89.6 fL (ref 80.0–100.0)
MPV: 11 fL (ref 7.5–12.5)
Monocytes Relative: 12.1 %
NEUTROS ABS: 2936 {cells}/uL (ref 1500–7800)
Neutrophils Relative %: 55.4 %
Platelets: 199 10*3/uL (ref 140–400)
RBC: 5.18 10*6/uL (ref 4.20–5.80)
RDW: 12.3 % (ref 11.0–15.0)
Total Lymphocyte: 30 %
WBC mixed population: 641 cells/uL (ref 200–950)
WBC: 5.3 10*3/uL (ref 3.8–10.8)

## 2017-03-25 LAB — LIPID PANEL
Cholesterol: 180 mg/dL (ref <200)
HDL: 48 mg/dL (ref >40)
LDL Cholesterol (Calc): 102 mg/dL (calc) — ABNORMAL HIGH
NON-HDL CHOLESTEROL (CALC): 132 mg/dL — AB (ref <130)
Total CHOL/HDL Ratio: 3.8 (calc) (ref <5.0)
Triglycerides: 182 mg/dL — ABNORMAL HIGH (ref <150)

## 2017-03-25 LAB — BASIC METABOLIC PANEL WITH GFR
BUN: 16 mg/dL (ref 7–25)
CO2: 31 mmol/L (ref 20–32)
CREATININE: 0.99 mg/dL (ref 0.70–1.25)
Calcium: 9.6 mg/dL (ref 8.6–10.3)
Chloride: 103 mmol/L (ref 98–110)
GFR, EST NON AFRICAN AMERICAN: 82 mL/min/{1.73_m2} (ref >=60)
GFR, Est African American: 95 mL/min/{1.73_m2} (ref >=60)
Glucose, Bld: 106 mg/dL — ABNORMAL HIGH (ref 65–99)
POTASSIUM: 4.8 mmol/L (ref 3.5–5.3)
Sodium: 140 mmol/L (ref 135–146)

## 2017-03-25 LAB — HEMOGLOBIN A1C
EAG (MMOL/L): 6.6 (calc)
Hgb A1c MFr Bld: 5.8 % of total Hgb — ABNORMAL HIGH (ref <5.7)
MEAN PLASMA GLUCOSE: 120 (calc)

## 2017-03-25 LAB — MAGNESIUM: MAGNESIUM: 1.9 mg/dL (ref 1.5–2.5)

## 2017-03-25 LAB — TSH: TSH: 1.11 m[IU]/L (ref 0.40–4.50)

## 2017-03-25 LAB — VITAMIN D 25 HYDROXY (VIT D DEFICIENCY, FRACTURES): Vit D, 25-Hydroxy: 74 ng/mL (ref 30–100)

## 2017-05-28 ENCOUNTER — Other Ambulatory Visit: Payer: Self-pay | Admitting: Physician Assistant

## 2017-05-28 DIAGNOSIS — I10 Essential (primary) hypertension: Secondary | ICD-10-CM

## 2017-06-22 ENCOUNTER — Other Ambulatory Visit: Payer: Self-pay | Admitting: Internal Medicine

## 2017-06-22 DIAGNOSIS — I10 Essential (primary) hypertension: Secondary | ICD-10-CM

## 2017-07-20 ENCOUNTER — Encounter: Payer: Self-pay | Admitting: Internal Medicine

## 2017-07-20 NOTE — Patient Instructions (Signed)

## 2017-07-20 NOTE — Progress Notes (Signed)
[facility]   Wesley Oneal, M.D.     Dyanne CarrelAmanda R. Steffanie Dunnollier, P.A.-C Judd GaudierAshley Corbett, DNP [facility]                [address]                [place] [id number] Telephone [phone] Telefax [phone] Annual  Screening/Preventative Visit  & Comprehensive Evaluation & Examination     This very nice 62 y.o. [name] presents for a Screening/Preventative Visit & comprehensive evaluation and management of multiple medical co-morbidities.  Patient has been followed for HTN, HLD, Prediabetes and Vitamin D Deficiency.Patient also has GERD controlled on prilosec 20 qod and is agreeable to try to switch to Ranitidine 150 mg bid.     HTN predates since Feb 2017. Patient's BP has been controlled at home.  Today's BP is elevated at 150/94 and patient reports home BP's are ranging 130-150/90-110. Patient denies any cardiac symptoms as chest pain, palpitations, shortness of breath, dizziness or ankle swelling.     Patient's hyperlipidemia is near controlled with diet. Last lipids were near goal: Lab Results  Component Value Date   CHOL 180 03/24/2017   HDL 48 03/24/2017   LDLCALC 102 (H) 03/24/2017   TRIG 182 (H) 03/24/2017   CHOLHDL 3.8 03/24/2017      Patient has Morbid Obesity (BMI 33+) and prediabetes (A1c 6.1%/Feb 2017)  and patient denies reactive hypoglycemic symptoms, visual blurring, diabetic polys or paresthesias. Last A1c was near goal: Lab Results  Component Value Date   HGBA1C 5.8 (H) 03/24/2017       Finally, patient has history of Vitamin D Deficiency and last vitamin D was at goal: Lab Results  Component Value Date   VD25OH 74 03/24/2017   Current Outpatient Medications on File Prior to Visit  Medication Sig  . aspirin EC 81 MG tablet Take daily.  Marland Kitchen. atenolol  100 MG tablet TAKE 1 TAB EVERY MORNING  . VITAMIN D  5,000 Units Takedaily.  Marland Kitchen. losartan  50 MG tablet TAKE 1 TAB EVERY EVENING  . PRILOSEC 20 mg  OTC  Take daily.   Allergies  Allergen Reactions  . Lisinopril Cough  . Penicillins    Past Medical History:  Diagnosis Date  . Hyperlipidemia   . Hypertension Feb 2017  . Prediabetes Feb 2017   . Vitamin D deficiency disease    Health Maintenance  Topic Date Due  . Hepatitis C Screening  1955-10-07  . HIV Screening  05/21/1970  . COLONOSCOPY  05/21/2005  . INFLUENZA VACCINE  11/05/2017  . TETANUS/TDAP  09/05/2024   Immunization History  Administered Date(s) Administered  . PPD Test 05/25/2015, 06/25/2016  . Td 09/06/2014   Last Colon - Never - Mother recently dx'd with colon cancer - To be scheduled for Colonoscopy.   Family History  Problem Relation Age of Onset  . Cancer Mother   . Hypertension Mother   . Hypertension Father   . Heart disease Father    Social History   Socioeconomic History  . Marital status: Married    Spouse name: Not on file  . Number of children: 1 son & 1 daughter      ROS Constitutional: Denies fever, chills, weight loss/gain, headaches, insomnia,  night sweats or change in appetite. Does c/o fatigue. Eyes: Denies redness, blurred vision, diplopia, discharge, itchy or watery eyes.  ENT: Denies discharge, congestion, post nasal drip, epistaxis, sore throat, earache,  hearing loss, dental pain, Tinnitus, Vertigo, Sinus pain or snoring.  Cardio: Denies chest pain, palpitations, irregular heartbeat, syncope, dyspnea, diaphoresis, orthopnea, PND, claudication or edema Respiratory: denies cough, dyspnea, DOE, pleurisy, hoarseness, laryngitis or wheezing.  Gastrointestinal: Denies dysphagia, heartburn, reflux, water brash, pain, cramps, nausea, vomiting, bloating, diarrhea, constipation, hematemesis, melena, hematochezia, jaundice or hemorrhoids Genitourinary: Denies dysuria, frequency, urgency, nocturia, hesitancy, discharge, hematuria or flank pain Musculoskeletal: Denies arthralgia, myalgia, stiffness, Jt. Swelling, pain, limp or strain/sprain.  Denies Falls. Skin: Denies puritis, rash, hives, warts, acne, eczema or change in skin lesion Neuro: No weakness, tremor, incoordination, spasms, paresthesia or pain Psychiatric: Denies confusion, memory loss or sensory loss. Denies Depression. Endocrine: Denies change in weight, skin, hair change, nocturia, and paresthesia, diabetic polys, visual blurring or hyper / hypo glycemic episodes.  Heme/Lymph: No excessive bleeding, bruising or enlarged lymph nodes.  Physical Exam  BP (!) 150/94   Pulse 77   Temp 98.1 F (36.7 C)   Resp 14   Ht 5' 8.75" (1.746 m)   Wt 223 lb (101.2 kg)   BMI 33.17 kg/m   General Appearance: Well nourished  and in no apparent distress.  Eyes: PERRLA, EOMs, conjunctiva no swelling or erythema, normal fundi and vessels. Sinuses: No frontal/maxillary tenderness ENT/Mouth: EACs patent / TMs  nl. Nares clear without erythema, swelling, mucoid exudates. Oral hygiene is good. No erythema, swelling, or exudate. Tongue normal, non-obstructing. Tonsils not swollen or erythematous. Hearing normal.  Neck: Supple, thyroid not palpable. No bruits, nodes or JVD. Respiratory: Respiratory effort normal.  BS equal and clear bilateral without rales, rhonci, wheezing or stridor. Cardio: Heart sounds are normal with regular rate and rhythm and no murmurs, rubs or gallops. Peripheral pulses are normal and equal bilaterally without edema. No aortic or femoral bruits. Chest: symmetric with normal excursions and percussion.  Abdomen: Soft, with Nl bowel sounds. Nontender, no guarding, rebound, hernias, masses, or organomegaly.  Lymphatics: Non tender without lymphadenopathy.  Genitourinary:  DRE - deferred in anticipation of scheduling Colonoscopy Musculoskeletal: Full ROM all peripheral extremities, joint stability, 5/5 strength, and normal gait. Skin: Warm and dry without rashes, lesions, cyanosis, clubbing or  ecchymosis.  Neuro: Cranial nerves intact, reflexes equal bilaterally.  Normal muscle tone, no cerebellar symptoms. Sensation intact.  Pysch: Alert and oriented X 3 with normal affect, insight and judgment appropriate.   Assessment and Plan  1. Annual Preventative/Screening Exam   2. Essential hypertension  - Add Rx Minoxidil 10 mg #90  x 1 RF- sig to start at 1/2 tab qd for BP   - EKG 12-Lead - [place], RETROPERITNL ABD,  LTD - Urinalysis, Routine w reflex microscopic - Microalbumin / creatinine urine ratio - CBC with Differential/Platelet - BASIC METABOLIC PANEL WITH GFR - Magnesium - TSH  3. Hyperlipidemia, mixed  - EKG 12-Lead - [place], RETROPERITNL ABD,  LTD - Hepatic function panel - Lipid panel - TSH  4. Prediabetes  - EKG 12-Lead - [place], RETROPERITNL ABD,  LTD - Hemoglobin A1c - Insulin, random  5. Vitamin D deficiency  - VITAMIN D 25 Hydroxyl  6. Screening for colorectal cancer  - POC Hemoccult Bld/Stl  7. Prostate cancer screening  - PSA  8. Screening for ischemic heart disease  - EKG 12-Lead  9. Screening for AAA (aortic abdominal aneurysm)  - [place], RETROPERITNL ABD,  LTD  10. Former smoker  - EKG 12-Lead - [place], RETROPERITNL ABD,  LTD  11. FHx: heart disease  - EKG 12-Lead - [place], RETROPERITNL ABD,  LTD  12. Fatigue, unspecified type  - Vitamin B12 - Iron,Total/Total Iron Binding Cap - Testosterone - CBC with Differential/Platelet  13. Medication management  - Urinalysis, Routine w reflex microscopic - Microalbumin / creatinine urine ratio - CBC with Differential/Platelet - BASIC METABOLIC PANEL WITH GFR - Hepatic function panel - Magnesium - Lipid panel - TSH - Hemoglobin A1c - Insulin, random - VITAMIN D 25 Hydroxyl           Patient was counseled in prudent diet, weight control to achieve/maintain BMI less than 25, BP monitoring, regular exercise and medications as discussed.  Discussed med effects and SE's. Routine screening labs and tests as requested with regular follow-up as recommended. Over 40  minutes of exam, counseling, chart review and high complex critical decision making was performed

## 2017-07-21 ENCOUNTER — Encounter: Payer: Self-pay | Admitting: Internal Medicine

## 2017-07-21 ENCOUNTER — Ambulatory Visit: Payer: BLUE CROSS/BLUE SHIELD | Admitting: Internal Medicine

## 2017-07-21 VITALS — BP 150/94 | HR 77 | Temp 98.1°F | Resp 14 | Ht 68.75 in | Wt 223.0 lb

## 2017-07-21 DIAGNOSIS — I10 Essential (primary) hypertension: Secondary | ICD-10-CM | POA: Diagnosis not present

## 2017-07-21 DIAGNOSIS — Z1211 Encounter for screening for malignant neoplasm of colon: Secondary | ICD-10-CM

## 2017-07-21 DIAGNOSIS — Z1212 Encounter for screening for malignant neoplasm of rectum: Secondary | ICD-10-CM

## 2017-07-21 DIAGNOSIS — Z131 Encounter for screening for diabetes mellitus: Secondary | ICD-10-CM

## 2017-07-21 DIAGNOSIS — Z8249 Family history of ischemic heart disease and other diseases of the circulatory system: Secondary | ICD-10-CM

## 2017-07-21 DIAGNOSIS — Z136 Encounter for screening for cardiovascular disorders: Secondary | ICD-10-CM

## 2017-07-21 DIAGNOSIS — Z79899 Other long term (current) drug therapy: Secondary | ICD-10-CM

## 2017-07-21 DIAGNOSIS — Z87891 Personal history of nicotine dependence: Secondary | ICD-10-CM

## 2017-07-21 DIAGNOSIS — Z0001 Encounter for general adult medical examination with abnormal findings: Secondary | ICD-10-CM

## 2017-07-21 DIAGNOSIS — Z111 Encounter for screening for respiratory tuberculosis: Secondary | ICD-10-CM

## 2017-07-21 DIAGNOSIS — Z1329 Encounter for screening for other suspected endocrine disorder: Secondary | ICD-10-CM

## 2017-07-21 DIAGNOSIS — E782 Mixed hyperlipidemia: Secondary | ICD-10-CM

## 2017-07-21 DIAGNOSIS — Z0000 Encounter for general adult medical examination without abnormal findings: Secondary | ICD-10-CM | POA: Diagnosis not present

## 2017-07-21 DIAGNOSIS — Z1389 Encounter for screening for other disorder: Secondary | ICD-10-CM | POA: Diagnosis not present

## 2017-07-21 DIAGNOSIS — Z130 Encounter for screening for diseases of the blood and blood-forming organs and certain disorders involving the immune mechanism: Secondary | ICD-10-CM | POA: Diagnosis not present

## 2017-07-21 DIAGNOSIS — E559 Vitamin D deficiency, unspecified: Secondary | ICD-10-CM | POA: Diagnosis not present

## 2017-07-21 DIAGNOSIS — Z13220 Encounter for screening for lipoid disorders: Secondary | ICD-10-CM | POA: Diagnosis not present

## 2017-07-21 DIAGNOSIS — R7303 Prediabetes: Secondary | ICD-10-CM

## 2017-07-21 DIAGNOSIS — Z125 Encounter for screening for malignant neoplasm of prostate: Secondary | ICD-10-CM

## 2017-07-21 DIAGNOSIS — R5383 Other fatigue: Secondary | ICD-10-CM

## 2017-07-21 MED ORDER — MINOXIDIL 10 MG PO TABS: ORAL_TABLET | ORAL | 3 refills | Status: DC

## 2017-07-21 MED ORDER — RANITIDINE HCL 150 MG PO TABS: ORAL_TABLET | ORAL | 3 refills | Status: DC

## 2017-07-21 MED ORDER — SILDENAFIL CITRATE 100 MG PO TABS: ORAL_TABLET | ORAL | 11 refills | Status: DC

## 2017-07-22 LAB — PSA: PSA: 1 ng/mL (ref <=4.0)

## 2017-07-22 LAB — HEMOGLOBIN A1C
Hgb A1c MFr Bld: 5.9 % of total Hgb — ABNORMAL HIGH (ref <5.7)
MEAN PLASMA GLUCOSE: 123 (calc)
eAG (mmol/L): 6.8 (calc)

## 2017-07-22 LAB — CBC WITH DIFFERENTIAL/PLATELET
BASOS ABS: 50 {cells}/uL (ref 0–200)
BASOS PCT: 0.9 %
EOS PCT: 2.2 %
Eosinophils Absolute: 121 cells/uL (ref 15–500)
HCT: 47 % (ref 38.5–50.0)
HEMOGLOBIN: 16.4 g/dL (ref 13.2–17.1)
Lymphs Abs: 1766 cells/uL (ref 850–3900)
MCH: 30.8 pg (ref 27.0–33.0)
MCHC: 34.9 g/dL (ref 32.0–36.0)
MCV: 88.2 fL (ref 80.0–100.0)
MONOS PCT: 11.2 %
MPV: 10.9 fL (ref 7.5–12.5)
Neutro Abs: 2948 cells/uL (ref 1500–7800)
Neutrophils Relative %: 53.6 %
PLATELETS: 225 10*3/uL (ref 140–400)
RBC: 5.33 10*6/uL (ref 4.20–5.80)
RDW: 12.4 % (ref 11.0–15.0)
TOTAL LYMPHOCYTE: 32.1 %
WBC mixed population: 616 cells/uL (ref 200–950)
WBC: 5.5 10*3/uL (ref 3.8–10.8)

## 2017-07-22 LAB — MICROALBUMIN / CREATININE URINE RATIO
Creatinine, Urine: 224 mg/dL (ref 20–320)
MICROALB UR: 1 mg/dL
MICROALB/CREAT RATIO: 4 ug/mg{creat} (ref <30)

## 2017-07-22 LAB — URINALYSIS, ROUTINE W REFLEX MICROSCOPIC
Bilirubin Urine: NEGATIVE
Glucose, UA: NEGATIVE
HGB URINE DIPSTICK: NEGATIVE
Ketones, ur: NEGATIVE
LEUKOCYTES UA: NEGATIVE
NITRITE: NEGATIVE
Protein, ur: NEGATIVE
SPECIFIC GRAVITY, URINE: 1.027 (ref 1.001–1.03)

## 2017-07-22 LAB — IRON, TOTAL/TOTAL IRON BINDING CAP
%SAT: 42 % (ref 15–60)
Iron: 116 ug/dL (ref 50–180)
TIBC: 273 ug/dL (ref 250–425)

## 2017-07-22 LAB — TSH: TSH: 0.65 mIU/L (ref 0.40–4.50)

## 2017-07-22 LAB — BASIC METABOLIC PANEL WITH GFR
BUN: 16 mg/dL (ref 7–25)
CHLORIDE: 102 mmol/L (ref 98–110)
CO2: 32 mmol/L (ref 20–32)
CREATININE: 1.04 mg/dL (ref 0.70–1.25)
Calcium: 9.8 mg/dL (ref 8.6–10.3)
GFR, Est African American: 89 mL/min/{1.73_m2} (ref >=60)
GFR, Est Non African American: 77 mL/min/{1.73_m2} (ref >=60)
GLUCOSE: 94 mg/dL (ref 65–99)
Potassium: 4.4 mmol/L (ref 3.5–5.3)
Sodium: 140 mmol/L (ref 135–146)

## 2017-07-22 LAB — LIPID PANEL
CHOLESTEROL: 179 mg/dL (ref <200)
HDL: 45 mg/dL (ref >40)
LDL Cholesterol (Calc): 104 mg/dL (calc) — ABNORMAL HIGH
Non-HDL Cholesterol (Calc): 134 mg/dL (calc) — ABNORMAL HIGH (ref <130)
Total CHOL/HDL Ratio: 4 (calc) (ref <5.0)
Triglycerides: 178 mg/dL — ABNORMAL HIGH (ref <150)

## 2017-07-22 LAB — HEPATIC FUNCTION PANEL
AG RATIO: 1.9 (calc) (ref 1.0–2.5)
ALKALINE PHOSPHATASE (APISO): 41 U/L (ref 40–115)
ALT: 26 U/L (ref 9–46)
AST: 21 U/L (ref 10–35)
Albumin: 4.8 g/dL (ref 3.6–5.1)
Bilirubin, Direct: 0.1 mg/dL (ref 0.0–0.2)
Globulin: 2.5 g/dL (calc) (ref 1.9–3.7)
Indirect Bilirubin: 0.7 mg/dL (calc) (ref 0.2–1.2)
TOTAL PROTEIN: 7.3 g/dL (ref 6.1–8.1)
Total Bilirubin: 0.8 mg/dL (ref 0.2–1.2)

## 2017-07-22 LAB — TESTOSTERONE: Testosterone: 440 ng/dL (ref 250–827)

## 2017-07-22 LAB — INSULIN, RANDOM: INSULIN: 14.4 u[IU]/mL (ref 2.0–19.6)

## 2017-07-22 LAB — VITAMIN D 25 HYDROXY (VIT D DEFICIENCY, FRACTURES): VIT D 25 HYDROXY: 79 ng/mL (ref 30–100)

## 2017-07-22 LAB — MAGNESIUM: MAGNESIUM: 1.9 mg/dL (ref 1.5–2.5)

## 2017-07-22 LAB — VITAMIN B12: Vitamin B-12: 615 pg/mL (ref 200–1100)

## 2017-08-05 ENCOUNTER — Other Ambulatory Visit: Payer: Self-pay | Admitting: Internal Medicine

## 2017-08-05 MED ORDER — HYDROCHLOROTHIAZIDE 25 MG PO TABS: ORAL_TABLET | ORAL | 1 refills | Status: DC

## 2017-08-21 ENCOUNTER — Encounter: Payer: Self-pay | Admitting: Internal Medicine

## 2017-08-24 ENCOUNTER — Other Ambulatory Visit: Payer: Self-pay | Admitting: Adult Health

## 2017-08-24 DIAGNOSIS — I10 Essential (primary) hypertension: Secondary | ICD-10-CM

## 2017-09-11 ENCOUNTER — Other Ambulatory Visit: Payer: Self-pay | Admitting: Adult Health

## 2017-09-11 DIAGNOSIS — I10 Essential (primary) hypertension: Secondary | ICD-10-CM

## 2017-10-13 ENCOUNTER — Other Ambulatory Visit: Payer: Self-pay | Admitting: Adult Health

## 2017-10-13 DIAGNOSIS — I10 Essential (primary) hypertension: Secondary | ICD-10-CM

## 2017-11-12 NOTE — Progress Notes (Signed)
FOLLOW UP  Assessment and Plan:   Hypertension Well controlled with current medications  Monitor blood pressure at home; patient to call if consistently greater than 130/80 Continue DASH diet.   Reminder to go to the ER if any CP, SOB, nausea, dizziness, severe HA, changes vision/speech, left arm numbness and tingling and jaw pain.  Cholesterol Currently managed by lifestyle with borderline control  Continue low cholesterol diet and exercise.  Check lipid panel.   Prediabetes Discussed disease and risks Discussed diet/exercise, weight management  A1C  Obesity with co morbidities Long discussion about weight loss, diet, and exercise Discussed ideal weight for height and initial weight goal  Patient will work on cutting out soda/sweet tea, white bread Will follow up in 4 months  GERD Well managed on current medications; didn;t do well with taper with ranitidine Discussed diet, avoiding triggers and other lifestyle changes  Vitamin D Def/ osteoporosis prevention At goal at recent check; continue to recommend supplementation for goal of 70-100 Defer vitamin D level   Continue diet and meds as discussed. Further disposition pending results of labs. Discussed med's effects and SE's.   Over 30 minutes of exam, counseling, chart review, and critical decision making was performed.   Future Appointments  Date Time Provider Department Center  02/15/2018  9:30 AM Lucky CowboyMcKeown, William, MD [facility] None  08/17/2018  9:00 AM Lucky CowboyMcKeown, William, MD [facility] None    ----------------------------------------------------------------------------------------------------------------------  HPI 62 y.o. male  presents for 3 month follow up on hypertension, cholesterol, prediabetes, obesity, GERD and vitamin D deficiency. He has recently retired in Feb 2019.   BMI is Body mass index is 32.58 kg/m., he has not been working on diet and exercise. Wt Readings from Last 3 Encounters:  11/13/17  219 lb (99.3 kg)  07/21/17 223 lb (101.2 kg)  03/24/17 217 lb (98.4 kg)   he has a diagnosis of GERD which is currently managed by prilosec 20 mg daily, tried to switch but didn't tolerate ranitidine.  he reports symptoms is currently well controlled, and denies breakthrough reflux, burning in chest, hoarseness or cough.    His blood pressure has not been controlled at home, today their BP is BP: 124/74  He does not workout. He denies chest pain, shortness of breath, dizziness.   He is not on cholesterol medication and denies myalgias. His cholesterol is not at goal. The cholesterol last visit was:   Lab Results  Component Value Date   CHOL 179 07/21/2017   HDL 45 07/21/2017   LDLCALC 104 (H) 07/21/2017   TRIG 178 (H) 07/21/2017   CHOLHDL 4.0 07/21/2017    He has not been working on diet and exercise for prediabetes, and denies increased appetite, nausea, paresthesia of the feet, polydipsia, polyuria, visual disturbances and vomiting. Last A1C in the office was:  Lab Results  Component Value Date   HGBA1C 5.9 (H) 07/21/2017   Patient is on Vitamin D supplement and at goal:    Lab Results  Component Value Date   VD25OH 79 07/21/2017        Current Medications:  Current Outpatient Medications on File Prior to Visit  Medication Sig  . aspirin EC 81 MG tablet Take 81 mg by mouth daily.  Marland Kitchen. atenolol (TENORMIN) 100 MG tablet TAKE 1 TABLET BY MOUTH EVERY MORNING FOR BLOOD PRESSURE  . Cholecalciferol (VITAMIN D PO) Take 5,000 Units by mouth daily.  . hydrochlorothiazide (HYDRODIURIL) 25 MG tablet Take 1 tablet daily as needed for Fluid Retention  .  losartan (COZAAR) 50 MG tablet TAKE 1 TABLET BY MOUTH EVERY EVENING  . minoxidil (LONITEN) 10 MG tablet Take 1/2 to 1 tablet daily for BP  . Omeprazole Magnesium (PRILOSEC OTC PO) Take 20 mg by mouth daily.  . ranitidine (ZANTAC) 150 MG tablet Take 1 to 2 tablets daily as needed  for indigestion & heartbhurn  . sildenafil (VIAGRA) 100 MG  tablet Take 1/2 to 1 tablet daily as needed for XXXX (Patient not taking: Reported on 11/13/2017)   No current facility-administered medications on file prior to visit.      Allergies:  Allergies  Allergen Reactions  . Lisinopril Cough  . Penicillins      Medical History:  Past Medical History:  Diagnosis Date  . Hyperlipidemia   . Hypertension Feb 2017  . Prediabetes Feb 2017   . Vitamin D deficiency disease    Family history- Reviewed and unchanged Social history- Reviewed and unchanged   Review of Systems:  Review of Systems  Constitutional: Negative for malaise/fatigue and weight loss.  HENT: Negative for hearing loss and tinnitus.   Eyes: Negative for blurred vision and double vision.  Respiratory: Negative for cough, shortness of breath and wheezing.   Cardiovascular: Negative for chest pain, palpitations, orthopnea, claudication and leg swelling.  Gastrointestinal: Negative for abdominal pain, blood in stool, constipation, diarrhea, heartburn, melena, nausea and vomiting.  Genitourinary: Negative.   Musculoskeletal: Negative for joint pain and myalgias.  Skin: Negative for rash.  Neurological: Negative for dizziness, tingling, sensory change, weakness and headaches.  Endo/Heme/Allergies: Negative for polydipsia.  Psychiatric/Behavioral: Negative.   All other systems reviewed and are negative.     Physical Exam: BP 124/74   Pulse 72   Temp (!) 97.5 F (36.4 C)   Ht 5' 8.75" (1.746 m)   Wt 219 lb (99.3 kg)   SpO2 98%   BMI 32.58 kg/m  Wt Readings from Last 3 Encounters:  11/13/17 219 lb (99.3 kg)  07/21/17 223 lb (101.2 kg)  03/24/17 217 lb (98.4 kg)   General Appearance: Well nourished, in no apparent distress. Eyes: PERRLA, EOMs, conjunctiva no swelling or erythema Sinuses: No Frontal/maxillary tenderness ENT/Mouth: Ext aud canals clear, TMs without erythema, bulging. No erythema, swelling, or exudate on post pharynx.  Tonsils not swollen or  erythematous. Hearing normal.  Neck: Supple, thyroid normal.  Respiratory: Respiratory effort normal, BS equal bilaterally without rales, rhonchi, wheezing or stridor.  Cardio: RRR with no MRGs. Brisk peripheral pulses without edema.  Abdomen: Soft, + BS.  Non tender, no guarding, rebound, hernias, masses. Lymphatics: Non tender without lymphadenopathy.  Musculoskeletal: Full ROM, 5/5 strength, Normal gait Skin: Warm, dry without rashes, lesions, ecchymosis.  Neuro: Cranial nerves intact. No cerebellar symptoms.  Psych: Awake and oriented X 3, normal affect, Insight and Judgment appropriate.    Dan Maker, NP 8:51 AM [facility]

## 2017-11-13 ENCOUNTER — Encounter: Payer: Self-pay | Admitting: Adult Health

## 2017-11-13 ENCOUNTER — Ambulatory Visit (INDEPENDENT_AMBULATORY_CARE_PROVIDER_SITE_OTHER): Payer: BLUE CROSS/BLUE SHIELD | Admitting: Adult Health

## 2017-11-13 VITALS — BP 124/74 | HR 72 | Temp 97.5°F | Ht 68.75 in | Wt 219.0 lb

## 2017-11-13 DIAGNOSIS — E66811 Obesity, class 1: Secondary | ICD-10-CM

## 2017-11-13 DIAGNOSIS — E669 Obesity, unspecified: Secondary | ICD-10-CM

## 2017-11-13 DIAGNOSIS — E782 Mixed hyperlipidemia: Secondary | ICD-10-CM | POA: Diagnosis not present

## 2017-11-13 DIAGNOSIS — I10 Essential (primary) hypertension: Secondary | ICD-10-CM

## 2017-11-13 DIAGNOSIS — R7303 Prediabetes: Secondary | ICD-10-CM | POA: Diagnosis not present

## 2017-11-13 DIAGNOSIS — K219 Gastro-esophageal reflux disease without esophagitis: Secondary | ICD-10-CM | POA: Diagnosis not present

## 2017-11-13 DIAGNOSIS — E559 Vitamin D deficiency, unspecified: Secondary | ICD-10-CM | POA: Diagnosis not present

## 2017-11-13 DIAGNOSIS — Z79899 Other long term (current) drug therapy: Secondary | ICD-10-CM | POA: Diagnosis not present

## 2017-11-13 NOTE — Patient Instructions (Addendum)
  [employer] Free [facility] Test with no obligation # [id number] Do not have to be a member Tues-Sat 10-6  [facility]- free test with no obligation # [phone] MUST BE A MEMBER Call for store hours  Have had patient's get good cheaper hearing aids from [facility] The air version has good reviews.    Know what a healthy weight is for you (roughly BMI <25) and aim to maintain this  Aim for 7+ servings of fruits and vegetables daily  65-80+ fluid ounces of water or unsweet tea for healthy kidneys  Limit to max 1-2 drink of alcohol per day (less is better); avoid smoking/tobacco  Limit animal fats in diet for cholesterol and heart health - choose grass fed whenever available  Avoid highly processed foods, and foods high in saturated/trans fats  Aim for low stress - take time to unwind and care for your mental health  Aim for 150 min of moderate intensity exercise weekly for heart health, and weights twice weekly for bone health  Aim for 7-9 hours of sleep daily    Recommend cutting down on sugar intake  - the American Heart Association recommends no more than 9 teaspoons (38 g) of added sugar for men daily, and 6 teaspoons (25 g) for women. Added sugar can be in many things that you might not expect - salad dressings, bread that is not home made, "all natural" fruit juice, etc., most processed foods contain hidden sugars. Consider looking at labels and being aware of how much sugar you are consuming in a day. Less is always better for sugar; sugar reduces your body's immune response, and damages blood vessels, leading to increased risk of many diseases.         Bad carbs also include fruit juice, alcohol, and sweet tea. These are empty calories that do not signal to your brain that you are full.   Please remember the good carbs are still carbs which convert into sugar. So please measure them out no more than 1/2-1 cup of rice, oatmeal, pasta, and  beans  Veggies are however free foods! Pile them on.   Not all fruit is created equal. Please see the list below, the fruit at the bottom is higher in sugars than the fruit at the top. Please avoid all dried fruits.

## 2017-11-14 LAB — CBC WITH DIFFERENTIAL/PLATELET
BASOS ABS: 42 {cells}/uL (ref 0–200)
Basophils Relative: 0.8 %
Eosinophils Absolute: 80 cells/uL (ref 15–500)
Eosinophils Relative: 1.5 %
HEMATOCRIT: 46.6 % (ref 38.5–50.0)
Hemoglobin: 15.9 g/dL (ref 13.2–17.1)
LYMPHS ABS: 1701 {cells}/uL (ref 850–3900)
MCH: 30.6 pg (ref 27.0–33.0)
MCHC: 34.1 g/dL (ref 32.0–36.0)
MCV: 89.6 fL (ref 80.0–100.0)
MPV: 11 fL (ref 7.5–12.5)
Monocytes Relative: 11.4 %
NEUTROS PCT: 54.2 %
Neutro Abs: 2873 cells/uL (ref 1500–7800)
Platelets: 234 10*3/uL (ref 140–400)
RBC: 5.2 10*6/uL (ref 4.20–5.80)
RDW: 12.4 % (ref 11.0–15.0)
Total Lymphocyte: 32.1 %
WBC: 5.3 10*3/uL (ref 3.8–10.8)
WBCMIX: 604 {cells}/uL (ref 200–950)

## 2017-11-14 LAB — COMPLETE METABOLIC PANEL WITH GFR
AG RATIO: 1.7 (calc) (ref 1.0–2.5)
ALT: 17 U/L (ref 9–46)
AST: 16 U/L (ref 10–35)
Albumin: 4.7 g/dL (ref 3.6–5.1)
Alkaline phosphatase (APISO): 41 U/L (ref 40–115)
BUN: 18 mg/dL (ref 7–25)
CALCIUM: 10 mg/dL (ref 8.6–10.3)
CO2: 29 mmol/L (ref 20–32)
CREATININE: 1.17 mg/dL (ref 0.70–1.25)
Chloride: 99 mmol/L (ref 98–110)
GFR, EST AFRICAN AMERICAN: 77 mL/min/{1.73_m2} (ref >=60)
GFR, EST NON AFRICAN AMERICAN: 66 mL/min/{1.73_m2} (ref >=60)
Globulin: 2.7 g/dL (calc) (ref 1.9–3.7)
Glucose, Bld: 103 mg/dL — ABNORMAL HIGH (ref 65–99)
Potassium: 4.3 mmol/L (ref 3.5–5.3)
Sodium: 141 mmol/L (ref 135–146)
TOTAL PROTEIN: 7.4 g/dL (ref 6.1–8.1)
Total Bilirubin: 0.7 mg/dL (ref 0.2–1.2)

## 2017-11-14 LAB — LIPID PANEL
CHOL/HDL RATIO: 4 (calc) (ref <5.0)
CHOLESTEROL: 165 mg/dL (ref <200)
HDL: 41 mg/dL (ref >40)
LDL CHOLESTEROL (CALC): 95 mg/dL
NON-HDL CHOLESTEROL (CALC): 124 mg/dL (ref <130)
TRIGLYCERIDES: 203 mg/dL — AB (ref <150)

## 2017-11-14 LAB — MAGNESIUM: Magnesium: 1.9 mg/dL (ref 1.5–2.5)

## 2017-11-14 LAB — HEMOGLOBIN A1C
Hgb A1c MFr Bld: 5.9 % of total Hgb — ABNORMAL HIGH (ref <5.7)
Mean Plasma Glucose: 123 (calc)
eAG (mmol/L): 6.8 (calc)

## 2017-11-14 LAB — TSH: TSH: 1.42 mIU/L (ref 0.40–4.50)

## 2018-01-10 ENCOUNTER — Other Ambulatory Visit: Payer: Self-pay | Admitting: Adult Health

## 2018-01-10 ENCOUNTER — Other Ambulatory Visit: Payer: Self-pay | Admitting: Internal Medicine

## 2018-01-10 DIAGNOSIS — I10 Essential (primary) hypertension: Secondary | ICD-10-CM

## 2018-01-10 MED ORDER — LOSARTAN POTASSIUM 50 MG PO TABS: ORAL_TABLET | ORAL | 1 refills | Status: DC

## 2018-02-12 ENCOUNTER — Encounter: Payer: Self-pay | Admitting: Internal Medicine

## 2018-02-12 NOTE — Progress Notes (Signed)
This very nice 62 y.o. [name] presents for 6 month follow up with HTN, HLD, Pre-Diabetes and Vitamin D Deficiency.      Patient is treated for HTN (2017) & BP has been controlled at home. Today's BP is at goal -  122/72. Patient has had no complaints of any cardiac type chest pain, palpitations, dyspnea / orthopnea / PND, dizziness, claudication, or dependent edema.     Hyperlipidemia is controlled with diet & meds. Patient denies myalgias or other med SE's. Last Lipids were at goal albeit elevated Trig's: Lab Results  Component Value Date   CHOL 165 11/13/2017   HDL 41 11/13/2017   LDLCALC 95 11/13/2017   TRIG 203 (H) 11/13/2017   CHOLHDL 4.0 11/13/2017      Also, the patient has history of Morbid Obesity (BMI 33+) &  PreDiabetes  (A1c 6.1% / Feb 2017)  and has had no symptoms of reactive hypoglycemia, diabetic polys, paresthesias or visual blurring.  Last A1c was not at goal: Lab Results  Component Value Date   HGBA1C 5.9 (H) 11/13/2017      Further, the patient also has history of Vitamin D Deficiency and supplements vitamin D without any suspected side-effects. Last vitamin D was at goal:  Lab Results  Component Value Date   VD25OH 79 07/21/2017   Current Outpatient Medications on File Prior to Visit  Medication Sig  . aspirin EC 81 MG tablet Take 81 mg by mouth daily.  Marland Kitchen atenolol (TENORMIN) 100 MG tablet TAKE 1 TABLET BY MOUTH EVERY MORNING FOR BLOOD PRESSURE  . Cholecalciferol (VITAMIN D PO) Take 5,000 Units by mouth daily.  . hydrochlorothiazide (HYDRODIURIL) 25 MG tablet TAKE 1 TABLET BY MOUTH DAILY AS NEEDED FOR FLUID RETENTION  . losartan (COZAAR) 50 MG tablet Take 1 tablet daily  for BP  . minoxidil (LONITEN) 10 MG tablet Take 1/2 to 1 tablet daily for BP  . Omeprazole Magnesium (PRILOSEC OTC PO) Take 20 mg by mouth daily.  . ranitidine (ZANTAC) 150 MG tablet Take 1 to 2 tablets daily as needed  for indigestion & heartbhurn  . sildenafil (VIAGRA) 100 MG tablet Take 1/2  to 1 tablet daily as needed for XXXX   No current facility-administered medications on file prior to visit.    Allergies  Allergen Reactions  . Lisinopril Cough  . Penicillins    PMHx:   Past Medical History:  Diagnosis Date  . Hyperlipidemia   . Hypertension Feb 2017  . Prediabetes Feb 2017   . Vitamin D deficiency disease    Immunization History  Administered Date(s) Administered  . PPD Test 05/25/2015, 06/25/2016  . Td 09/06/2014   History reviewed. No pertinent surgical history.   FHx:    Reviewed / unchanged  SHx:    Reviewed / unchanged   Systems Review:  Constitutional: Denies fever, chills, wt changes, headaches, insomnia, fatigue, night sweats, change in appetite. Eyes: Denies redness, blurred vision, diplopia, discharge, itchy, watery eyes.  ENT: Denies discharge, congestion, post nasal drip, epistaxis, sore throat, earache, hearing loss, dental pain, tinnitus, vertigo, sinus pain, snoring.  CV: Denies chest pain, palpitations, irregular heartbeat, syncope, dyspnea, diaphoresis, orthopnea, PND, claudication or edema. Respiratory: denies cough, dyspnea, DOE, pleurisy, hoarseness, laryngitis, wheezing.  Gastrointestinal: Denies dysphagia, odynophagia, heartburn, reflux, water brash, abdominal pain or cramps, nausea, vomiting, bloating, diarrhea, constipation, hematemesis, melena, hematochezia  or hemorrhoids. Genitourinary: Denies dysuria, frequency, urgency, nocturia, hesitancy, discharge, hematuria or flank pain. Musculoskeletal: Denies arthralgias, myalgias, stiffness,  jt. swelling, pain, limping or strain/sprain.  Skin: Denies pruritus, rash, hives, warts, acne, eczema or change in skin lesion(s). Neuro: No weakness, tremor, incoordination, spasms, paresthesia or pain. Psychiatric: Denies confusion, memory loss or sensory loss. Endo: Denies change in weight, skin or hair change.  Heme/Lymph: No excessive bleeding, bruising or enlarged lymph nodes.  Physical  Exam  BP 122/72   Pulse 80   Temp 97.9 F (36.6 C)   Ht 5' 8.75" (1.746 m)   Wt 223 lb (101.2 kg)   SpO2 97%   BMI 33.17 kg/m   Appears  well nourished, well groomed  and in no distress.  Eyes: PERRLA, EOMs, conjunctiva no swelling or erythema. Sinuses: No frontal/maxillary tenderness ENT/Mouth: EAC's clear, TM's nl w/o erythema, bulging. Nares clear w/o erythema, swelling, exudates. Oropharynx clear without erythema or exudates. Oral hygiene is good. Tongue normal, non obstructing. Hearing intact.  Neck: Supple. Thyroid not palpable. Car 2+/2+ without bruits, nodes or JVD. Chest: Respirations nl with BS clear & equal w/o rales, rhonchi, wheezing or stridor.  Cor: Heart sounds normal w/ regular rate and rhythm without sig. murmurs, gallops, clicks or rubs. Peripheral pulses normal and equal  without edema.  Abdomen: Soft & bowel sounds normal. Non-tender w/o guarding, rebound, hernias, masses or organomegaly.  Lymphatics: Unremarkable.  Musculoskeletal: Full ROM all peripheral extremities, joint stability, 5/5 strength and normal gait.  Skin: Warm, dry without exposed rashes, lesions or ecchymosis apparent.  Neuro: Cranial nerves intact, reflexes equal bilaterally. Sensory-motor testing grossly intact. Tendon reflexes grossly intact.  Pysch: Alert & oriented x 3.  Insight and judgement nl & appropriate. No ideations.  Assessment and Plan:  1. Essential hypertension  - Continue medication, monitor blood pressure at home.  - Continue DASH diet.  Reminder to go to the ER if any CP,  SOB, nausea, dizziness, severe HA, changes vision/speech.  - CBC with Differential/Platelet - COMPLETE METABOLIC PANEL WITH GFR - Magnesium - TSH  2. Hyperlipidemia, mixed  - Continue diet/meds, exercise,& lifestyle modifications.  - Continue monitor periodic cholesterol/liver & renal functions    - Lipid panel - TSH  3. Prediabetes  - Continue diet, exercise, lifestyle modifications.  -  Monitor appropriate labs.  - Hemoglobin A1c - Insulin, random  4. Vitamin D deficiency  - Continue supplementation.   - VITAMIN D 25 Hydroxy   5. Gastroesophageal reflux disease  - CBC with Differential/Platelet  6. Medication management  - CBC with Differential/Platelet - COMPLETE METABOLIC PANEL WITH GFR - Magnesium - Lipid panel - TSH - Hemoglobin A1c - Insulin, random - VITAMIN D 25 Hydroxyl       Discussed  regular exercise, BP monitoring, weight control to achieve/maintain BMI less than 25 and discussed med and SE's. Recommended labs to assess and monitor clinical status with further disposition pending results of labs. Over 30 minutes of exam, counseling, chart review was performed.

## 2018-02-12 NOTE — Patient Instructions (Signed)

## 2018-02-15 ENCOUNTER — Ambulatory Visit: Payer: BLUE CROSS/BLUE SHIELD | Admitting: Internal Medicine

## 2018-02-15 ENCOUNTER — Encounter: Payer: Self-pay | Admitting: Internal Medicine

## 2018-02-15 VITALS — BP 122/72 | HR 80 | Temp 97.9°F | Ht 68.75 in | Wt 223.0 lb

## 2018-02-15 DIAGNOSIS — I10 Essential (primary) hypertension: Secondary | ICD-10-CM

## 2018-02-15 DIAGNOSIS — Z79899 Other long term (current) drug therapy: Secondary | ICD-10-CM | POA: Diagnosis not present

## 2018-02-15 DIAGNOSIS — R7303 Prediabetes: Secondary | ICD-10-CM

## 2018-02-15 DIAGNOSIS — E559 Vitamin D deficiency, unspecified: Secondary | ICD-10-CM | POA: Diagnosis not present

## 2018-02-15 DIAGNOSIS — E782 Mixed hyperlipidemia: Secondary | ICD-10-CM | POA: Diagnosis not present

## 2018-02-15 DIAGNOSIS — K219 Gastro-esophageal reflux disease without esophagitis: Secondary | ICD-10-CM

## 2018-02-16 LAB — COMPLETE METABOLIC PANEL WITH GFR
AG RATIO: 1.5 (calc) (ref 1.0–2.5)
ALT: 16 U/L (ref 9–46)
AST: 16 U/L (ref 10–35)
Albumin: 4.5 g/dL (ref 3.6–5.1)
Alkaline phosphatase (APISO): 39 U/L — ABNORMAL LOW (ref 40–115)
BUN: 16 mg/dL (ref 7–25)
CALCIUM: 10.1 mg/dL (ref 8.6–10.3)
CO2: 31 mmol/L (ref 20–32)
Chloride: 99 mmol/L (ref 98–110)
Creat: 1.01 mg/dL (ref 0.70–1.25)
GFR, EST AFRICAN AMERICAN: 92 mL/min/{1.73_m2} (ref >=60)
GFR, EST NON AFRICAN AMERICAN: 79 mL/min/{1.73_m2} (ref >=60)
GLOBULIN: 3 g/dL (ref 1.9–3.7)
Glucose, Bld: 97 mg/dL (ref 65–99)
POTASSIUM: 4 mmol/L (ref 3.5–5.3)
SODIUM: 138 mmol/L (ref 135–146)
TOTAL PROTEIN: 7.5 g/dL (ref 6.1–8.1)
Total Bilirubin: 0.6 mg/dL (ref 0.2–1.2)

## 2018-02-16 LAB — CBC WITH DIFFERENTIAL/PLATELET
BASOS ABS: 19 {cells}/uL (ref 0–200)
Basophils Relative: 0.4 %
EOS PCT: 1.5 %
Eosinophils Absolute: 71 cells/uL (ref 15–500)
HCT: 44.8 % (ref 38.5–50.0)
Hemoglobin: 15.6 g/dL (ref 13.2–17.1)
Lymphs Abs: 1152 cells/uL (ref 850–3900)
MCH: 30.9 pg (ref 27.0–33.0)
MCHC: 34.8 g/dL (ref 32.0–36.0)
MCV: 88.7 fL (ref 80.0–100.0)
MPV: 10.5 fL (ref 7.5–12.5)
Monocytes Relative: 12.7 %
Neutro Abs: 2862 cells/uL (ref 1500–7800)
Neutrophils Relative %: 60.9 %
PLATELETS: 234 10*3/uL (ref 140–400)
RBC: 5.05 10*6/uL (ref 4.20–5.80)
RDW: 12.7 % (ref 11.0–15.0)
TOTAL LYMPHOCYTE: 24.5 %
WBC mixed population: 597 cells/uL (ref 200–950)
WBC: 4.7 10*3/uL (ref 3.8–10.8)

## 2018-02-16 LAB — HEMOGLOBIN A1C
HEMOGLOBIN A1C: 5.6 %{Hb} (ref <5.7)
Mean Plasma Glucose: 114 (calc)
eAG (mmol/L): 6.3 (calc)

## 2018-02-16 LAB — TSH: TSH: 1.03 mIU/L (ref 0.40–4.50)

## 2018-02-16 LAB — LIPID PANEL
Cholesterol: 190 mg/dL (ref <200)
HDL: 43 mg/dL (ref >40)
LDL CHOLESTEROL (CALC): 111 mg/dL — AB
Non-HDL Cholesterol (Calc): 147 mg/dL (calc) — ABNORMAL HIGH (ref <130)
TRIGLYCERIDES: 235 mg/dL — AB (ref <150)
Total CHOL/HDL Ratio: 4.4 (calc) (ref <5.0)

## 2018-02-16 LAB — MAGNESIUM: Magnesium: 1.8 mg/dL (ref 1.5–2.5)

## 2018-02-16 LAB — VITAMIN D 25 HYDROXY (VIT D DEFICIENCY, FRACTURES): VIT D 25 HYDROXY: 63 ng/mL (ref 30–100)

## 2018-02-16 LAB — INSULIN, RANDOM: INSULIN: 41.6 u[IU]/mL — AB (ref 2.0–19.6)

## 2018-05-20 ENCOUNTER — Other Ambulatory Visit: Payer: Self-pay | Admitting: Internal Medicine

## 2018-05-20 DIAGNOSIS — I10 Essential (primary) hypertension: Secondary | ICD-10-CM

## 2018-07-09 ENCOUNTER — Other Ambulatory Visit: Payer: Self-pay | Admitting: Adult Health

## 2018-07-09 ENCOUNTER — Other Ambulatory Visit: Payer: Self-pay | Admitting: Internal Medicine

## 2018-07-09 DIAGNOSIS — I10 Essential (primary) hypertension: Secondary | ICD-10-CM

## 2018-08-17 ENCOUNTER — Encounter: Payer: Self-pay | Admitting: Internal Medicine

## 2018-08-28 ENCOUNTER — Other Ambulatory Visit: Payer: Self-pay | Admitting: Internal Medicine

## 2018-11-08 ENCOUNTER — Other Ambulatory Visit: Payer: Self-pay | Admitting: Internal Medicine

## 2018-11-08 DIAGNOSIS — I10 Essential (primary) hypertension: Secondary | ICD-10-CM

## 2018-11-08 MED ORDER — LOSARTAN POTASSIUM 50 MG PO TABS: ORAL_TABLET | ORAL | 3 refills | Status: DC

## 2018-12-01 ENCOUNTER — Encounter: Payer: Self-pay | Admitting: Internal Medicine

## 2018-12-01 NOTE — Patient Instructions (Signed)

## 2018-12-01 NOTE — Progress Notes (Signed)
Annual  Screening/Preventative Visit  & Comprehensive Evaluation & Examination     This very nice 63 y.o. [name] presents for a Screening /Preventative Visit & comprehensive evaluation and management of multiple medical co-morbidities.  Patient has been followed for HTN, HLD, Prediabetes and Vitamin D Deficiency.Patient has GERD controlled with diet & his meds.     HTN predates Circa Feb 2017.  Patient's BP has been controlled at home.  Today's BP is at goal -130/74. Patient denies any cardiac symptoms as chest pain, palpitations, shortness of breath, dizziness or ankle swelling.     Patient's hyperlipidemia is not controlled with diet. Last lipids were not at goal: Lab Results  Component Value Date   CHOL 190 02/15/2018   HDL 43 02/15/2018   LDLCALC 111 (H) 02/15/2018   TRIG 235 (H) 02/15/2018   CHOLHDL 4.4 02/15/2018      Patient has Moderate Obesity (BMI 33+) and hx/o prediabetes (A1c 6.1%  / Feb 2017) and patient denies reactive hypoglycemic symptoms, visual blurring, diabetic polys or paresthesias. Last A1c was at goal: Lab Results  Component Value Date   HGBA1C 5.6 02/15/2018       Finally, patient has history of Vitamin D Deficiency ("37" / 2010) and last vitamin D was at goal: Lab Results  Component Value Date   VD25OH 63 02/15/2018   Current Outpatient Medications on File Prior to Visit  Medication Sig  . aspirin EC 81 MG tablet Take 81 mg by mouth daily.  Marland Kitchen. atenolol (TENORMIN) 100 MG tablet TAKE 1 TABLET BY MOUTH EVERY MORNING FOR BLOOD PRESSURE  . Cholecalciferol (VITAMIN D PO) Take 5,000 Units by mouth daily.  . hydrochlorothiazide (HYDRODIURIL) 25 MG tablet TAKE 1 TABLET BY MOUTH DAILY AS NEEDED FOR FLUID RETENTION  . losartan (COZAAR) 50 MG tablet Take 1 tablet Daily for BP  . Magnesium 500 MG TABS Take 1 tablet by mouth daily.  . minoxidil (LONITEN) 10 MG tablet TAKE 1/2 TO 1 TABLET BY MOUTH EVERY DAY AS NEEDED FOR BLOOD PRESSURE  . Omeprazole Magnesium (PRILOSEC  OTC PO) Take 20 mg by mouth daily.  . sildenafil (VIAGRA) 100 MG tablet Take 1/2 to 1 tablet daily as needed for XXXX   No current facility-administered medications on file prior to visit.    Allergies  Allergen Reactions  . Lisinopril Cough  . Penicillins    Past Medical History:  Diagnosis Date  . Hyperlipidemia   . Hypertension Feb 2017  . Prediabetes Feb 2017   . Vitamin D deficiency disease    Health Maintenance  Topic Date Due  . Hepatitis C Screening  29-Oct-1955  . HIV Screening  05/21/1970  . COLONOSCOPY  05/21/2005  . INFLUENZA VACCINE  11/06/2018  . TETANUS/TDAP  09/05/2024   Immunization History  Administered Date(s) Administered  . PPD Test 05/25/2015, 06/25/2016  . Td 09/06/2014   Last Colon -  Never, despite hx/o (+) colon ca in his mother  History reviewed. No pertinent surgical history.   Family History  Problem Relation Age of Onset  . Cancer Mother   . Hypertension Mother   . Hypertension Father   . Heart disease Father    Social History   Socioeconomic History  . Marital status: Married    Spouse name: Eunice BlaseDebbie  . Number of children: 1 son & 1 daughter  Occupational History  . Not on file  Tobacco Use  . Smoking status: Former Games developermoker  . Smokeless tobacco: Former Careers information officerUser    Quit  date: 04/07/2001  Substance and Sexual Activity  . Alcohol use: Yes    Alcohol/week: 24.0 standard drinks    Types: 24 Standard drinks or equivalent per week    Comment: 1 case of beer and 2 shots of vodka  . Drug use: Not on file  . Sexual activity: Not on file    ROS Constitutional: Denies fever, chills, weight loss/gain, headaches, insomnia,  night sweats or change in appetite. Does c/o fatigue. Eyes: Denies redness, blurred vision, diplopia, discharge, itchy or watery eyes.  ENT: Denies discharge, congestion, post nasal drip, epistaxis, sore throat, earache, hearing loss, dental pain, Tinnitus, Vertigo, Sinus pain or snoring.  Cardio: Denies chest pain,  palpitations, irregular heartbeat, syncope, dyspnea, diaphoresis, orthopnea, PND, claudication or edema Respiratory: denies cough, dyspnea, DOE, pleurisy, hoarseness, laryngitis or wheezing.  Gastrointestinal: Denies dysphagia, heartburn, reflux, water brash, pain, cramps, nausea, vomiting, bloating, diarrhea, constipation, hematemesis, melena, hematochezia, jaundice or hemorrhoids Genitourinary: Denies dysuria, frequency, discharge, hematuria or flank pain. Has urgency, nocturia x 2-3 & occasional hesitancy. Musculoskeletal: Denies arthralgia, myalgia, stiffness, Jt. Swelling, pain, limp or strain/sprain. Denies Falls. Skin: Denies puritis, rash, hives, warts, acne, eczema or change in skin lesion Neuro: No weakness, tremor, incoordination, spasms, paresthesia or pain Psychiatric: Denies confusion, memory loss or sensory loss. Denies Depression. Endocrine: Denies change in weight, skin, hair change, nocturia, and paresthesia, diabetic polys, visual blurring or hyper / hypo glycemic episodes.  Heme/Lymph: No excessive bleeding, bruising or enlarged lymph nodes.  Physical Exam  BP 130/74   Pulse 64   Temp (!) 97 F (36.1 C)   Resp 16   Ht 5' 8.5" (1.74 m)   Wt 221 lb 6.4 oz (100.4 kg)   BMI 33.17 kg/m   General Appearance: Well nourished and well groomed and in no apparent distress.  Eyes: PERRLA, EOMs, conjunctiva no swelling or erythema, normal fundi and vessels. Sinuses: No frontal/maxillary tenderness ENT/Mouth: EACs patent / TMs  nl. Nares clear without erythema, swelling, mucoid exudates. Oral hygiene is good. No erythema, swelling, or exudate. Tongue normal, non-obstructing. Tonsils not swollen or erythematous. Hearing normal.  Neck: Supple, thyroid not palpable. No bruits, nodes or JVD. Respiratory: Respiratory effort normal.  BS equal and clear bilateral without rales, rhonci, wheezing or stridor. Cardio: Heart sounds are normal with regular rate and rhythm and no murmurs, rubs  or gallops. Peripheral pulses are normal and equal bilaterally without edema. No aortic or femoral bruits. Chest: symmetric with normal excursions and percussion.  Abdomen: Soft, with Nl bowel sounds. Nontender, no guarding, rebound, hernias, masses, or organomegaly.  Lymphatics: Non tender without lymphadenopathy.  Musculoskeletal: Full ROM all peripheral extremities, joint stability, 5/5 strength, and normal gait. Skin: Warm and dry without rashes, lesions, cyanosis, clubbing or  ecchymosis.  Neuro: Cranial nerves intact, reflexes equal bilaterally. Normal muscle tone, no cerebellar symptoms. Sensation intact.  Pysch: Alert and oriented X 3 with normal affect, insight and judgment appropriate.   Assessment and Plan  1. Annual Preventative/Screening Exam   2. Essential hypertension  - EKG 12-Lead - [place], RETROPERITNL ABD,  LTD - Urinalysis, Routine w reflex microscopic - Microalbumin / creatinine urine ratio - CBC with Differential/Platelet - COMPLETE METABOLIC PANEL WITH GFR - Magnesium - TSH  3. Hyperlipidemia, mixed  - EKG 12-Lead - [place], RETROPERITNL ABD,  LTD - Lipid panel - TSH  4. Abnormal glucose  - EKG 12-Lead - [place], RETROPERITNL ABD,  LTD - Hemoglobin A1c - Insulin, random  5. Vitamin D deficiency  - VITAMIN  D 25 Hydroxyl  6. Prediabetes  - EKG 12-Lead - [place], RETROPERITNL ABD,  LTD - Hemoglobin A1c - Insulin, random  7. Gastroesophageal reflux disease  - CBC with Differential/Platelet  8. Obesity (BMI 30.0-34.9)   9. Screening examination for pulmonary tuberculosis  - TB Skin Test  10. Screening for colorectal cancer  - POC Hemoccult Bld/St - Ambulatory referral to Gastroenterology  11. FHx: colon cancer  - Ambulatory referral to Gastroenterology  12. Prostate cancer screening  - PSA  13. Screening for ischemic heart disease  - EKG 12-Lead  14. FHx: heart disease  - EKG 12-Lead - [place], RETROPERITNL ABD,  LTD  15. Former smoker  -  EKG 12-Lead - [place], RETROPERITNL ABD,  LTD  16. Screening for AAA (aortic abdominal aneurysm)  - [place], RETROPERITNL ABD,  LTD  17. Fatigue, unspecified type  - Iron,Total/Total Iron Binding Cap - Vitamin B12 - CBC with Differential/Platelet  18. Medication management  - Urinalysis, Routine w reflex microscopic - Microalbumin / creatinine urine ratio - CBC with Differential/Platelet - COMPLETE METABOLIC PANEL WITH GFR - Magnesium - Lipid panel - TSH - Hemoglobin A1c - Insulin, random - VITAMIN D 25 Hydroxyl      Patient was counseled in prudent diet, weight control to achieve/maintain BMI less than 25, BP monitoring, regular exercise and medications as discussed.  Discussed med effects and SE's. Routine screening labs and tests as requested with regular follow-up as recommended. Over 40 minutes of exam, counseling, chart review and high complex critical decision making was performed   Marinus Maw, MD

## 2018-12-02 ENCOUNTER — Ambulatory Visit: Payer: BC Managed Care – PPO | Admitting: Internal Medicine

## 2018-12-02 ENCOUNTER — Other Ambulatory Visit: Payer: Self-pay

## 2018-12-02 VITALS — BP 130/74 | HR 64 | Temp 97.0°F | Resp 16 | Ht 68.5 in | Wt 221.4 lb

## 2018-12-02 DIAGNOSIS — Z111 Encounter for screening for respiratory tuberculosis: Secondary | ICD-10-CM

## 2018-12-02 DIAGNOSIS — K219 Gastro-esophageal reflux disease without esophagitis: Secondary | ICD-10-CM

## 2018-12-02 DIAGNOSIS — Z1389 Encounter for screening for other disorder: Secondary | ICD-10-CM | POA: Diagnosis not present

## 2018-12-02 DIAGNOSIS — R7303 Prediabetes: Secondary | ICD-10-CM

## 2018-12-02 DIAGNOSIS — R350 Frequency of micturition: Secondary | ICD-10-CM | POA: Diagnosis not present

## 2018-12-02 DIAGNOSIS — N401 Enlarged prostate with lower urinary tract symptoms: Secondary | ICD-10-CM | POA: Diagnosis not present

## 2018-12-02 DIAGNOSIS — Z79899 Other long term (current) drug therapy: Secondary | ICD-10-CM

## 2018-12-02 DIAGNOSIS — Z131 Encounter for screening for diabetes mellitus: Secondary | ICD-10-CM | POA: Diagnosis not present

## 2018-12-02 DIAGNOSIS — E559 Vitamin D deficiency, unspecified: Secondary | ICD-10-CM | POA: Diagnosis not present

## 2018-12-02 DIAGNOSIS — Z1329 Encounter for screening for other suspected endocrine disorder: Secondary | ICD-10-CM | POA: Diagnosis not present

## 2018-12-02 DIAGNOSIS — I10 Essential (primary) hypertension: Secondary | ICD-10-CM

## 2018-12-02 DIAGNOSIS — Z1212 Encounter for screening for malignant neoplasm of rectum: Secondary | ICD-10-CM

## 2018-12-02 DIAGNOSIS — Z13220 Encounter for screening for lipoid disorders: Secondary | ICD-10-CM

## 2018-12-02 DIAGNOSIS — Z136 Encounter for screening for cardiovascular disorders: Secondary | ICD-10-CM

## 2018-12-02 DIAGNOSIS — R5383 Other fatigue: Secondary | ICD-10-CM

## 2018-12-02 DIAGNOSIS — Z87891 Personal history of nicotine dependence: Secondary | ICD-10-CM

## 2018-12-02 DIAGNOSIS — Z125 Encounter for screening for malignant neoplasm of prostate: Secondary | ICD-10-CM

## 2018-12-02 DIAGNOSIS — Z0000 Encounter for general adult medical examination without abnormal findings: Secondary | ICD-10-CM | POA: Diagnosis not present

## 2018-12-02 DIAGNOSIS — Z1211 Encounter for screening for malignant neoplasm of colon: Secondary | ICD-10-CM

## 2018-12-02 DIAGNOSIS — E782 Mixed hyperlipidemia: Secondary | ICD-10-CM

## 2018-12-02 DIAGNOSIS — Z8249 Family history of ischemic heart disease and other diseases of the circulatory system: Secondary | ICD-10-CM

## 2018-12-02 DIAGNOSIS — E669 Obesity, unspecified: Secondary | ICD-10-CM

## 2018-12-02 DIAGNOSIS — Z130 Encounter for screening for diseases of the blood and blood-forming organs and certain disorders involving the immune mechanism: Secondary | ICD-10-CM

## 2018-12-02 DIAGNOSIS — Z800 Family history of malignant neoplasm of digestive organs: Secondary | ICD-10-CM

## 2018-12-02 DIAGNOSIS — E66811 Obesity, class 1: Secondary | ICD-10-CM

## 2018-12-02 DIAGNOSIS — R7309 Other abnormal glucose: Secondary | ICD-10-CM

## 2018-12-02 DIAGNOSIS — Z0001 Encounter for general adult medical examination with abnormal findings: Secondary | ICD-10-CM

## 2018-12-03 DIAGNOSIS — R7309 Other abnormal glucose: Secondary | ICD-10-CM | POA: Insufficient documentation

## 2018-12-03 DIAGNOSIS — Z800 Family history of malignant neoplasm of digestive organs: Secondary | ICD-10-CM | POA: Insufficient documentation

## 2018-12-03 LAB — COMPLETE METABOLIC PANEL WITH GFR
AG Ratio: 1.8 (calc) (ref 1.0–2.5)
ALT: 19 U/L (ref 9–46)
AST: 16 U/L (ref 10–35)
Albumin: 4.5 g/dL (ref 3.6–5.1)
Alkaline phosphatase (APISO): 36 U/L (ref 35–144)
BUN: 15 mg/dL (ref 7–25)
CO2: 31 mmol/L (ref 20–32)
Calcium: 9.5 mg/dL (ref 8.6–10.3)
Chloride: 104 mmol/L (ref 98–110)
Creat: 1.1 mg/dL (ref 0.70–1.25)
GFR, Est African American: 82 mL/min/{1.73_m2} (ref >=60)
GFR, Est Non African American: 71 mL/min/{1.73_m2} (ref >=60)
Globulin: 2.5 g/dL (calc) (ref 1.9–3.7)
Glucose, Bld: 102 mg/dL — ABNORMAL HIGH (ref 65–99)
Potassium: 4 mmol/L (ref 3.5–5.3)
Sodium: 140 mmol/L (ref 135–146)
Total Bilirubin: 0.6 mg/dL (ref 0.2–1.2)
Total Protein: 7 g/dL (ref 6.1–8.1)

## 2018-12-03 LAB — CBC WITH DIFFERENTIAL/PLATELET
Absolute Monocytes: 436 cells/uL (ref 200–950)
Basophils Absolute: 20 cells/uL (ref 0–200)
Basophils Relative: 0.5 %
Eosinophils Absolute: 80 cells/uL (ref 15–500)
Eosinophils Relative: 2 %
HCT: 43 % (ref 38.5–50.0)
Hemoglobin: 14.6 g/dL (ref 13.2–17.1)
Lymphs Abs: 1460 cells/uL (ref 850–3900)
MCH: 29.8 pg (ref 27.0–33.0)
MCHC: 34 g/dL (ref 32.0–36.0)
MCV: 87.8 fL (ref 80.0–100.0)
MPV: 10.6 fL (ref 7.5–12.5)
Monocytes Relative: 10.9 %
Neutro Abs: 2004 cells/uL (ref 1500–7800)
Neutrophils Relative %: 50.1 %
Platelets: 221 10*3/uL (ref 140–400)
RBC: 4.9 10*6/uL (ref 4.20–5.80)
RDW: 13.5 % (ref 11.0–15.0)
Total Lymphocyte: 36.5 %
WBC: 4 10*3/uL (ref 3.8–10.8)

## 2018-12-03 LAB — MAGNESIUM: Magnesium: 1.8 mg/dL (ref 1.5–2.5)

## 2018-12-03 LAB — VITAMIN B12: Vitamin B-12: 380 pg/mL (ref 200–1100)

## 2018-12-03 LAB — URINALYSIS, ROUTINE W REFLEX MICROSCOPIC
Bilirubin Urine: NEGATIVE
Glucose, UA: NEGATIVE
Hgb urine dipstick: NEGATIVE
Ketones, ur: NEGATIVE
Leukocytes,Ua: NEGATIVE
Nitrite: NEGATIVE
Protein, ur: NEGATIVE
Specific Gravity, Urine: 1.016 (ref 1.001–1.03)
pH: 6.5 (ref 5.0–8.0)

## 2018-12-03 LAB — INSULIN, RANDOM: Insulin: 13.1 u[IU]/mL

## 2018-12-03 LAB — IRON, TOTAL/TOTAL IRON BINDING CAP
%SAT: 43 % (calc) (ref 20–48)
Iron: 108 ug/dL (ref 50–180)
TIBC: 250 mcg/dL (calc) (ref 250–425)

## 2018-12-03 LAB — HEMOGLOBIN A1C
Hgb A1c MFr Bld: 5.6 % of total Hgb (ref <5.7)
Mean Plasma Glucose: 114 (calc)
eAG (mmol/L): 6.3 (calc)

## 2018-12-03 LAB — TSH: TSH: 0.88 mIU/L (ref 0.40–4.50)

## 2018-12-03 LAB — LIPID PANEL
Cholesterol: 155 mg/dL (ref <200)
HDL: 42 mg/dL (ref >=40)
LDL Cholesterol (Calc): 90 mg/dL (calc)
Non-HDL Cholesterol (Calc): 113 mg/dL (calc) (ref <130)
Total CHOL/HDL Ratio: 3.7 (calc) (ref <5.0)
Triglycerides: 124 mg/dL (ref <150)

## 2018-12-03 LAB — MICROALBUMIN / CREATININE URINE RATIO
Creatinine, Urine: 93 mg/dL (ref 20–320)
Microalb Creat Ratio: 4 mcg/mg creat (ref <30)
Microalb, Ur: 0.4 mg/dL

## 2018-12-03 LAB — PSA: PSA: 1 ng/mL (ref <=4.0)

## 2018-12-03 LAB — VITAMIN D 25 HYDROXY (VIT D DEFICIENCY, FRACTURES): Vit D, 25-Hydroxy: 71 ng/mL (ref 30–100)

## 2018-12-06 LAB — TB SKIN TEST
Induration: 0 mm
TB Skin Test: NEGATIVE

## 2019-01-05 ENCOUNTER — Encounter: Payer: Self-pay | Admitting: Internal Medicine

## 2019-01-12 ENCOUNTER — Other Ambulatory Visit: Payer: Self-pay | Admitting: Internal Medicine

## 2019-01-12 DIAGNOSIS — I10 Essential (primary) hypertension: Secondary | ICD-10-CM

## 2019-01-12 MED ORDER — HYDROCHLOROTHIAZIDE 25 MG PO TABS: ORAL_TABLET | ORAL | 3 refills | Status: DC

## 2019-01-13 ENCOUNTER — Other Ambulatory Visit: Payer: Self-pay | Admitting: *Deleted

## 2019-01-13 ENCOUNTER — Other Ambulatory Visit: Payer: Self-pay | Admitting: Internal Medicine

## 2019-01-13 DIAGNOSIS — I10 Essential (primary) hypertension: Secondary | ICD-10-CM

## 2019-01-13 MED ORDER — ATENOLOL 100 MG PO TABS: ORAL_TABLET | ORAL | 3 refills | Status: DC

## 2019-01-13 MED ORDER — HYDROCHLOROTHIAZIDE 25 MG PO TABS: ORAL_TABLET | ORAL | 3 refills | Status: DC

## 2019-03-02 NOTE — Progress Notes (Signed)
FOLLOW UP  Assessment and Plan:   Hypertension Well controlled with current medications  Monitor blood pressure at home; patient to call if consistently greater than 130/80 Continue DASH diet.   Reminder to go to the ER if any CP, SOB, nausea, dizziness, severe HA, changes vision/speech, left arm numbness and tingling and jaw pain.  Cholesterol Currently managed by lifestyle with intermittent mild elevations Continue low cholesterol diet and exercise.  Check lipid panel.   Prediabetes Recent A1Cs at goal Discussed diet/exercise, weight management  Defer A1C; check CMP  Obesity with co morbidities Long discussion about weight loss, diet, and exercise Discussed ideal weight for height and initial weight goal  Patient will work on cutting out soda/sweet tea, white bread Will follow up in 4 months  GERD Well managed on current medications; didn;t do well with taper with ranitidine Discussed diet, avoiding triggers and other lifestyle changes  Vitamin D Def/ osteoporosis prevention At goal at recent check; continue to recommend supplementation for goal of 60-100 Defer vitamin D level   Continue diet and meds as discussed. Further disposition pending results of labs. Discussed med's effects and SE's.   Over 30 minutes of exam, counseling, chart review, and critical decision making was performed.   Future Appointments  Date Time Provider [place]  06/08/2019  9:30 AM Unk Pinto, MD [facility] None  01/04/2020  9:00 AM Unk Pinto, MD [facility] None    ----------------------------------------------------------------------------------------------------------------------  HPI 63 y.o. male  presents for 3 month follow up on hypertension, cholesterol, prediabetes, obesity, GERD and vitamin D deficiency.   He has recently retired in Feb 2019. He is fishing a lot and very happy.   BMI is Body mass index is 33.95 kg/m., he has not been working on diet and  exercise.  Wt Readings from Last 3 Encounters:  03/08/19 226 lb 9.6 oz (102.8 kg)  12/02/18 221 lb 6.4 oz (100.4 kg)  02/15/18 223 lb (101.2 kg)   he has a diagnosis of GERD which is currently managed by prilosec 20 mg daily, tried to switch but didn't tolerate ranitidine.  he reports symptoms is currently well controlled, and denies breakthrough reflux, burning in chest, hoarseness or cough.    His blood pressure has been controlled at home, checks only occasionally as has been stable, today their BP is BP: 110/74  He does not workout. He denies chest pain, shortness of breath, dizziness.   He is not on cholesterol medication and denies myalgias. His cholesterol is at goal. The cholesterol last visit was:   Lab Results  Component Value Date   CHOL 155 12/02/2018   HDL 42 12/02/2018   LDLCALC 90 12/02/2018   TRIG 124 12/02/2018   CHOLHDL 3.7 12/02/2018    He has not been working on diet and exercise for hx prediabetes, last 2 A1Cs normalized, and denies increased appetite, nausea, paresthesia of the feet, polydipsia, polyuria, visual disturbances and vomiting. Last A1C in the office was:  Lab Results  Component Value Date   HGBA1C 5.6 12/02/2018   Lab Results  Component Value Date   GFRNONAA 71 12/02/2018   Patient is on Vitamin D supplement and at goal:    Lab Results  Component Value Date   VD25OH 71 12/02/2018        Current Medications:  Current Outpatient Medications on File Prior to Visit  Medication Sig  . aspirin EC 81 MG tablet Take 81 mg by mouth daily.  Marland Kitchen atenolol (TENORMIN) 100 MG tablet Take 1  tablet Daily for BP  . Cholecalciferol (VITAMIN D PO) Take 5,000 Units by mouth daily.  . hydrochlorothiazide (HYDRODIURIL) 25 MG tablet Take 1 tablet Daily for BP & Fluid Retention / Ankle Swelling  . losartan (COZAAR) 50 MG tablet Take 1 tablet Daily for BP  . Magnesium 500 MG TABS Take 1 tablet by mouth daily.  . minoxidil (LONITEN) 10 MG tablet TAKE 1/2 TO 1  TABLET BY MOUTH EVERY DAY AS NEEDED FOR BLOOD PRESSURE  . Multiple Vitamins-Minerals (HAIR SKIN AND NAILS FORMULA PO) Take by mouth daily.  . Omeprazole Magnesium (PRILOSEC OTC PO) Take 20 mg by mouth daily.  . Tadalafil (CIALIS PO) Take by mouth as needed.  . sildenafil (VIAGRA) 100 MG tablet Take 1/2 to 1 tablet daily as needed for XXXX (Patient not taking: Reported on 03/08/2019)   No current facility-administered medications on file prior to visit.      Allergies:  Allergies  Allergen Reactions  . Lisinopril Cough  . Penicillins      Medical History:  Past Medical History:  Diagnosis Date  . Hyperlipidemia   . Hypertension Feb 2017  . Prediabetes Feb 2017   . Vitamin D deficiency disease    Family history- Reviewed and unchanged Social history- Reviewed and unchanged   Review of Systems:  Review of Systems  Constitutional: Negative for malaise/fatigue and weight loss.  HENT: Negative for hearing loss and tinnitus.   Eyes: Negative for blurred vision and double vision.  Respiratory: Negative for cough, shortness of breath and wheezing.   Cardiovascular: Negative for chest pain, palpitations, orthopnea, claudication and leg swelling.  Gastrointestinal: Negative for abdominal pain, blood in stool, constipation, diarrhea, heartburn, melena, nausea and vomiting.  Genitourinary: Negative.   Musculoskeletal: Negative for joint pain and myalgias.  Skin: Negative for rash.  Neurological: Negative for dizziness, tingling, sensory change, weakness and headaches.  Endo/Heme/Allergies: Negative for polydipsia.  Psychiatric/Behavioral: Negative.   All other systems reviewed and are negative.     Physical Exam: BP 110/74   Pulse 82   Temp 97.9 F (36.6 C)   Ht 5' 8.5" (1.74 m)   Wt 226 lb 9.6 oz (102.8 kg)   SpO2 95%   BMI 33.95 kg/m  Wt Readings from Last 3 Encounters:  03/08/19 226 lb 9.6 oz (102.8 kg)  12/02/18 221 lb 6.4 oz (100.4 kg)  02/15/18 223 lb (101.2 kg)    General Appearance: Well nourished, in no apparent distress. Eyes: PERRLA, EOMs, conjunctiva no swelling or erythema Sinuses: No Frontal/maxillary tenderness ENT/Mouth: Ext aud canals clear, TMs without erythema, bulging. No erythema, swelling, or exudate on post pharynx.  Tonsils not swollen or erythematous. Hearing normal.  Neck: Supple, thyroid normal.  Respiratory: Respiratory effort normal, BS equal bilaterally without rales, rhonchi, wheezing or stridor.  Cardio: RRR with no MRGs. Brisk peripheral pulses without edema.  Abdomen: Soft, + BS.  Non tender, no guarding, rebound, hernias, masses. Lymphatics: Non tender without lymphadenopathy.  Musculoskeletal: Full ROM, 5/5 strength, Normal gait Skin: Warm, dry without rashes, lesions, ecchymosis.  Neuro: Cranial nerves intact. No cerebellar symptoms.  Psych: Awake and oriented X 3, normal affect, Insight and Judgment appropriate.    Dan Maker, NP 8:53 AM [facility]

## 2019-03-08 ENCOUNTER — Other Ambulatory Visit: Payer: Self-pay

## 2019-03-08 ENCOUNTER — Ambulatory Visit: Payer: BC Managed Care – PPO | Admitting: Adult Health

## 2019-03-08 ENCOUNTER — Encounter: Payer: Self-pay | Admitting: Adult Health

## 2019-03-08 VITALS — BP 110/74 | HR 82 | Temp 97.9°F | Ht 68.5 in | Wt 226.6 lb

## 2019-03-08 DIAGNOSIS — K219 Gastro-esophageal reflux disease without esophagitis: Secondary | ICD-10-CM | POA: Diagnosis not present

## 2019-03-08 DIAGNOSIS — E669 Obesity, unspecified: Secondary | ICD-10-CM

## 2019-03-08 DIAGNOSIS — E782 Mixed hyperlipidemia: Secondary | ICD-10-CM | POA: Diagnosis not present

## 2019-03-08 DIAGNOSIS — I10 Essential (primary) hypertension: Secondary | ICD-10-CM | POA: Diagnosis not present

## 2019-03-08 DIAGNOSIS — Z79899 Other long term (current) drug therapy: Secondary | ICD-10-CM | POA: Diagnosis not present

## 2019-03-08 DIAGNOSIS — R7309 Other abnormal glucose: Secondary | ICD-10-CM

## 2019-03-08 DIAGNOSIS — E559 Vitamin D deficiency, unspecified: Secondary | ICD-10-CM

## 2019-03-08 DIAGNOSIS — Z87898 Personal history of other specified conditions: Secondary | ICD-10-CM

## 2019-03-08 NOTE — Patient Instructions (Addendum)
Goals    . Blood Pressure < 130/80    . LDL CALC < 100    . Weight (lb) < 215 lb (97.5 kg)        Drink 1/2 your body weight in fluid ounces of water daily; drink a tall glass of water 30 min before meals  Don't eat until you're stuffed- listen to your stomach and eat until you are 80% full   Try eating off of a salad plate; wait 10 min after finishing before going back for seconds  Start by eating the vegetables on your plate; aim for 50% of your meals to be fruits or vegetables  Then eat your protein - lean meats (grass fed if possible), fish, beans, nuts in moderation  Eat your carbs/starch last ONLY if you still are hungry. If you can, stop before finishing it all  Avoid sugar and flour - the closer it looks to it's original form in nature, typically the better it is for you  Splurge in moderation - "assign" days when you get to splurge and have the "bad stuff" - I like to follow a 80% - 20% plan- "good" choices 80 % of the time, "bad" choices in moderation 20% of the time  Simple equation is: Calories out > calories in = weight loss - even if you eat the bad stuff, if you limit portions, you will still lose weight     SMALL CHANGES  We want weight loss that will last so you should lose 1-2 pounds a week.  THAT IS IT! Please pick THREE things a month to change. Once it is a habit check off the item. Then pick another three items off the list to become habits.  If you are already doing a habit on the list GREAT!  Cross that item off! o Don't drink your calories. Ie, alcohol, soda, fruit juice, and sweet tea.  o Drink more water. Drink a glass when you feel hungry or before each meal.  o Eat breakfast - Complex carb and protein (likeDannon light and fit yogurt, oatmeal, fruit, eggs, [place] bacon). o Measure your cereal.  Eat no more than one cup a day. (ie [place]) o Eat an apple a day. o Add a vegetable a day. o Try a new vegetable a month. o Use Pam! Stop using oil or butter  to cook. o Don't finish your plate or use smaller plates. o Share your dessert. o Eat sugar free Jello for dessert or frozen grapes. o Don't eat 2-3 hours before bed. o Switch to whole wheat bread, pasta, and brown rice. o Make healthier choices when you eat out. No fries! o Pick baked chicken, NOT fried. o Don't forget to SLOW DOWN when you eat. It is not going anywhere.  o Take the stairs. o Park far away in the parking lot o [employer] (or weights) for 10 minutes while watching TV. o Walk at work for 10 minutes during break. o Walk outside 1 time a week with your friend, kids, dog, or significant other. o Start a walking group at [place] the mall as much as you can tolerate.  o Keep a food diary. o Weigh yourself daily. o Walk for 15 minutes 3 days per week. o Cook at home more often and eat out less.  If life happens and you go back to old habits, it is okay.  Just start over. You can do it!   If you experience chest pain,  get short of breath, or tired during the exercise, please stop immediately and inform your doctor.

## 2019-03-09 LAB — COMPLETE METABOLIC PANEL WITH GFR
AG Ratio: 1.6 (calc) (ref 1.0–2.5)
ALT: 17 U/L (ref 9–46)
AST: 16 U/L (ref 10–35)
Albumin: 4.5 g/dL (ref 3.6–5.1)
Alkaline phosphatase (APISO): 38 U/L (ref 35–144)
BUN: 17 mg/dL (ref 7–25)
CO2: 29 mmol/L (ref 20–32)
Calcium: 9.7 mg/dL (ref 8.6–10.3)
Chloride: 101 mmol/L (ref 98–110)
Creat: 1.17 mg/dL (ref 0.70–1.25)
GFR, Est African American: 76 mL/min/{1.73_m2} (ref >=60)
GFR, Est Non African American: 66 mL/min/{1.73_m2} (ref >=60)
Globulin: 2.8 g/dL (calc) (ref 1.9–3.7)
Glucose, Bld: 108 mg/dL — ABNORMAL HIGH (ref 65–99)
Potassium: 4.1 mmol/L (ref 3.5–5.3)
Sodium: 140 mmol/L (ref 135–146)
Total Bilirubin: 0.7 mg/dL (ref 0.2–1.2)
Total Protein: 7.3 g/dL (ref 6.1–8.1)

## 2019-03-09 LAB — CBC WITH DIFFERENTIAL/PLATELET
Absolute Monocytes: 440 cells/uL (ref 200–950)
Basophils Absolute: 40 cells/uL (ref 0–200)
Basophils Relative: 1 %
Eosinophils Absolute: 68 cells/uL (ref 15–500)
Eosinophils Relative: 1.7 %
HCT: 46.7 % (ref 38.5–50.0)
Hemoglobin: 15.7 g/dL (ref 13.2–17.1)
Lymphs Abs: 1256 cells/uL (ref 850–3900)
MCH: 29.4 pg (ref 27.0–33.0)
MCHC: 33.6 g/dL (ref 32.0–36.0)
MCV: 87.5 fL (ref 80.0–100.0)
MPV: 10.7 fL (ref 7.5–12.5)
Monocytes Relative: 11 %
Neutro Abs: 2196 cells/uL (ref 1500–7800)
Neutrophils Relative %: 54.9 %
Platelets: 228 10*3/uL (ref 140–400)
RBC: 5.34 10*6/uL (ref 4.20–5.80)
RDW: 13.1 % (ref 11.0–15.0)
Total Lymphocyte: 31.4 %
WBC: 4 10*3/uL (ref 3.8–10.8)

## 2019-03-09 LAB — LIPID PANEL
Cholesterol: 178 mg/dL (ref <200)
HDL: 44 mg/dL (ref >=40)
LDL Cholesterol (Calc): 105 mg/dL (calc) — ABNORMAL HIGH
Non-HDL Cholesterol (Calc): 134 mg/dL (calc) — ABNORMAL HIGH (ref <130)
Total CHOL/HDL Ratio: 4 (calc) (ref <5.0)
Triglycerides: 172 mg/dL — ABNORMAL HIGH (ref <150)

## 2019-03-09 LAB — MAGNESIUM: Magnesium: 1.9 mg/dL (ref 1.5–2.5)

## 2019-03-09 LAB — TSH: TSH: 1.07 mIU/L (ref 0.40–4.50)

## 2019-06-07 ENCOUNTER — Encounter: Payer: Self-pay | Admitting: Internal Medicine

## 2019-06-07 NOTE — Progress Notes (Signed)
N  [facility]  [facility]  O  W                                                                                                                                                                                                                                                                                                                                                                                                                                                                                                                                               This very nice 64 y.o. [name] presents for 6 month follow up with HTN, HLD, Pre-Diabetes and Vitamin D Deficiency.  Patient has GERD controlled with his diet& OTC Omeprazole.      Patient is treated for HTN (2017) & BP has been controlled at home. Today's  . Patient has had no complaints of any cardiac type chest pain, palpitations,  dyspnea / orthopnea / PND, dizziness, claudication, or dependent edema.      Hyperlipidemia is near controlled with diet & meds. Patient denies myalgias or other med SE's. Last Lipids were not at goal:  Lab Results  Component Value Date   CHOL 178 03/08/2019   HDL 44 03/08/2019   LDLCALC 105 (H) 03/08/2019   TRIG 172 (H) 03/08/2019   CHOLHDL 4.0 03/08/2019    Also, the patient has moderate obesity (BMI 33+) and history of PreDiabetes (A1c 6.1%  / Feb 2017)  and has had no symptoms of reactive hypoglycemia, diabetic polys,  paresthesias or visual blurring.  Last A1c was Normal & at goal:   Lab Results  Component Value Date   HGBA1C 5.6 12/02/2018       Further, the patient also has history of Vitamin D Deficiency (A1c 6.1%  / Feb 2017)  and supplements vitamin D without any suspected side-effects. Last vitamin D was at goal:  Lab Results  Component Value Date   VD25OH 71 12/02/2018

## 2019-06-08 ENCOUNTER — Ambulatory Visit: Payer: BC Managed Care – PPO | Admitting: Internal Medicine

## 2019-08-01 ENCOUNTER — Ambulatory Visit: Payer: BC Managed Care – PPO | Admitting: Adult Health

## 2019-08-01 ENCOUNTER — Encounter: Payer: Self-pay | Admitting: Adult Health

## 2019-08-01 ENCOUNTER — Other Ambulatory Visit: Payer: Self-pay

## 2019-08-01 VITALS — BP 110/70 | HR 83 | Temp 96.8°F | Wt 228.0 lb

## 2019-08-01 DIAGNOSIS — J302 Other seasonal allergic rhinitis: Secondary | ICD-10-CM

## 2019-08-01 DIAGNOSIS — Z87891 Personal history of nicotine dependence: Secondary | ICD-10-CM | POA: Insufficient documentation

## 2019-08-01 MED ORDER — PREDNISONE 20 MG PO TABS: ORAL_TABLET | ORAL | 0 refills | Status: DC

## 2019-08-01 MED ORDER — FEXOFENADINE HCL 180 MG PO TABS: 180.0000 mg | ORAL_TABLET | Freq: Every day | ORAL | 2 refills | Status: DC

## 2019-08-01 NOTE — Progress Notes (Signed)
Assessment and Plan:  Wesley Oneal was seen today for acute visit.  Diagnoses and all orders for this visit:  Seasonal allergic rhinitis, unspecified trigger - Start allegra or other H2i, increase H20, allergy hygiene explained. Discussed the importance of avoiding unnecessary antibiotic therapy. Suggested symptomatic OTC remedies. Nasal saline spray for congestion. Nasal steroids, allergy pill, oral steroids given per strong patient preference; suggested next year he initiate antihistamine 2-3 weeks prior to typical onset of sx Follow up as needed if persistent, progressive or rebound sx.  -     predniSONE (DELTASONE) 20 MG tablet; 2 tablets daily for 3 days, 1 tablet daily for 4 days. -     fexofenadine (ALLEGRA) 180 MG tablet; Take 1 tablet (180 mg total) by mouth daily.  Further disposition pending results of labs. Discussed med's effects and SE's.   Over 15 minutes of exam, counseling, chart review, and critical decision making was performed.   Future Appointments  Date Time Provider [place]  01/04/2020  9:00 AM Unk Pinto, MD [facility] None    ------------------------------------------------------------------------------------------------------------------   HPI BP 110/70   Pulse 83   Temp (!) 96.8 F (36 C)   Wt 228 lb (103.4 kg)   SpO2 95%   BMI 34.16 kg/m   64 y.o.male former smoker with hx of htn, GERD, seasonal allergies presents for evaluation of 2 weeks of upper respiratory symptoms.   He reports 2 weeks ago after he was outdoors and walked through "a cloud of pollen" started having itching/scratchiness in his throat, runny nose, mucus in throat, runny/watery eyes. Denies sinus congestion, headache, sore throat. Reports post nasal drip, Denies chest congestion, dyspnea, CP, fever/chills. Reports has had some wheezing with night time coughing episodes.   Has been taking vicks nyquil which does help some but ran out this AM and overall not improving. He  does not currently take an antihistamine. Reports similar episodes for last several years, typically in April and resolve quickly with steroid taper which he is requesting today. No recent travel, denies known sick contacts.   Past Medical History:  Diagnosis Date  . Hyperlipidemia   . Hypertension Feb 2017  . Prediabetes Feb 2017   . Vitamin D deficiency disease      Allergies  Allergen Reactions  . Lisinopril Cough  . Penicillins     Current Outpatient Medications on File Prior to Visit  Medication Sig  . aspirin EC 81 MG tablet Take 81 mg by mouth daily.  Marland Kitchen atenolol (TENORMIN) 100 MG tablet Take 1 tablet Daily for BP  . Cholecalciferol (VITAMIN D PO) Take 5,000 Units by mouth daily.  . hydrochlorothiazide (HYDRODIURIL) 25 MG tablet Take 1 tablet Daily for BP & Fluid Retention / Ankle Swelling  . losartan (COZAAR) 50 MG tablet Take 1 tablet Daily for BP  . Magnesium 500 MG TABS Take 1 tablet by mouth daily.  . minoxidil (LONITEN) 10 MG tablet TAKE 1/2 TO 1 TABLET BY MOUTH EVERY DAY AS NEEDED FOR BLOOD PRESSURE  . Multiple Vitamins-Minerals (HAIR SKIN AND NAILS FORMULA PO) Take by mouth daily.  . Omeprazole Magnesium (PRILOSEC OTC PO) Take 20 mg by mouth daily.  . Tadalafil (CIALIS PO) Take by mouth as needed.   No current facility-administered medications on file prior to visit.    ROS: all negative except above.   Physical Exam:  BP 110/70   Pulse 83   Temp (!) 96.8 F (36 C)   Wt 228 lb (103.4 kg)   SpO2 95%  BMI 34.16 kg/m   General Appearance: Well nourished, in no apparent distress. Eyes: PERRLA, conjunctiva no swelling or erythema,  Sinuses: No Frontal/maxillary tenderness ENT/Mouth: Ext aud canals clear, TMs without erythema, bulging. Post pharynx with mild erythema, without swelling, or exudate.  Tonsils not swollen or erythematous. Hearing normal.  Neck: Supple Respiratory: Respiratory effort normal, BS equal bilaterally without rales, rhonchi, wheezing  or stridor.  Cardio: RRR with no MRGs. Brisk peripheral pulses without edema.  Abdomen: Soft, + BS.  Non tender. Lymphatics: Non tender without lymphadenopathy.  Musculoskeletal: No obvious deformity, normal gait.  Skin: Warm, dry without rashes, lesions, ecchymosis.  Neuro: Normal muscle tone Psych: Awake and oriented X 3, normal affect, Insight and Judgment appropriate.     Dan Maker, NP 11:00 AM [facility]

## 2019-08-01 NOTE — Patient Instructions (Addendum)
Suggest you get on fexofenadine - or claritin, allegra, zyrtec   Start 2-3 weeks prior to typical onset of symptoms     Allergic Rhinitis, Adult Allergic rhinitis is an allergic reaction that affects the mucous membrane inside the nose. It causes sneezing, a runny or stuffy nose, and the feeling of mucus going down the back of the throat (postnasal drip). Allergic rhinitis can be mild to severe. There are two types of allergic rhinitis:  Seasonal. This type is also called hay fever. It happens only during certain seasons.  Perennial. This type can happen at any time of the year. What are the causes? This condition happens when the body's defense system (immune system) responds to certain harmless substances called allergens as though they were germs.  Seasonal allergic rhinitis is triggered by pollen, which can come from grasses, trees, and weeds. Perennial allergic rhinitis may be caused by:  House dust mites.  Pet dander.  Mold spores. What are the signs or symptoms? Symptoms of this condition include:  Sneezing.  Runny or stuffy nose (nasal congestion).  Postnasal drip.  Itchy nose.  Tearing of the eyes.  Trouble sleeping.  Daytime sleepiness. How is this diagnosed? This condition may be diagnosed based on:  Your medical history.  A physical exam.  Tests to check for related conditions, such as: ? Asthma. ? Pink eye. ? Ear infection. ? Upper respiratory infection.  Tests to find out which allergens trigger your symptoms. These may include skin or blood tests. How is this treated? There is no cure for this condition, but treatment can help control symptoms. Treatment may include:  Taking medicines that block allergy symptoms, such as antihistamines. Medicine may be given as a shot, nasal spray, or pill.  Avoiding the allergen.  Desensitization. This treatment involves getting ongoing shots until your body becomes less sensitive to the allergen. This  treatment may be done if other treatments do not help.  If taking medicine and avoiding the allergen does not work, new, stronger medicines may be prescribed. Follow these instructions at home:  Find out what you are allergic to. Common allergens include smoke, dust, and pollen.  Avoid the things you are allergic to. These are some things you can do to help avoid allergens: ? Replace carpet with wood, tile, or vinyl flooring. Carpet can trap dander and dust. ? Do not smoke. Do not allow smoking in your home. ? Change your heating and air conditioning filter at least once a month. ? During allergy season:  Keep windows closed as much as possible.  Plan outdoor activities when pollen counts are lowest. This is usually during the evening hours.  When coming indoors, change clothing and shower before sitting on furniture or bedding.  Take over-the-counter and prescription medicines only as told by your health care provider.  Keep all follow-up visits as told by your health care provider. This is important. Contact a health care provider if:  You have a fever.  You develop a persistent cough.  You make whistling sounds when you breathe (you wheeze).  Your symptoms interfere with your normal daily activities. Get help right away if:  You have shortness of breath. Summary  This condition can be managed by taking medicines as directed and avoiding allergens.  Contact your health care provider if you develop a persistent cough or fever.  During allergy season, keep windows closed as much as possible. This information is not intended to replace advice given to you by your  health care provider. Make sure you discuss any questions you have with your health care provider. Document Revised: 03/06/2017 Document Reviewed: 05/01/2016 Elsevier Patient Education  2020 [employer].

## 2019-08-02 ENCOUNTER — Other Ambulatory Visit: Payer: Self-pay | Admitting: Internal Medicine

## 2019-08-02 ENCOUNTER — Telehealth: Payer: Self-pay | Admitting: *Deleted

## 2019-08-02 MED ORDER — AZITHROMYCIN 250 MG PO TABS: ORAL_TABLET | ORAL | 1 refills | Status: DC

## 2019-08-02 NOTE — Telephone Encounter (Signed)
Patient called and reported he was unable to sleep after taking Allegra 180 mg tablet yesterday. He states he did not start the Prednisone 20 mg. The patient states his mucus has a green ting today. Dr Oneta Rack sent in an RX for a Z-pak and advised him to take Prednisone 1 tasblet x 5 days, if he starts the medication.

## 2019-08-04 ENCOUNTER — Emergency Department (HOSPITAL_COMMUNITY): Payer: BC Managed Care – PPO

## 2019-08-04 ENCOUNTER — Encounter (HOSPITAL_COMMUNITY): Payer: Self-pay | Admitting: Emergency Medicine

## 2019-08-04 ENCOUNTER — Other Ambulatory Visit: Payer: Self-pay

## 2019-08-04 ENCOUNTER — Emergency Department (HOSPITAL_COMMUNITY)
Admission: EM | Admit: 2019-08-04 | Discharge: 2019-08-05 | Disposition: A | Discharge status: Home / Self Care | Payer: BC Managed Care – PPO | Attending: Emergency Medicine | Admitting: Emergency Medicine

## 2019-08-04 ENCOUNTER — Telehealth: Payer: Self-pay

## 2019-08-04 DIAGNOSIS — Z7982 Long term (current) use of aspirin: Secondary | ICD-10-CM | POA: Diagnosis not present

## 2019-08-04 DIAGNOSIS — R197 Diarrhea, unspecified: Secondary | ICD-10-CM | POA: Insufficient documentation

## 2019-08-04 DIAGNOSIS — U071 COVID-19: Secondary | ICD-10-CM | POA: Insufficient documentation

## 2019-08-04 DIAGNOSIS — Z87891 Personal history of nicotine dependence: Secondary | ICD-10-CM | POA: Insufficient documentation

## 2019-08-04 DIAGNOSIS — I10 Essential (primary) hypertension: Secondary | ICD-10-CM | POA: Insufficient documentation

## 2019-08-04 DIAGNOSIS — M7918 Myalgia, other site: Secondary | ICD-10-CM | POA: Insufficient documentation

## 2019-08-04 DIAGNOSIS — R509 Fever, unspecified: Secondary | ICD-10-CM | POA: Diagnosis not present

## 2019-08-04 DIAGNOSIS — R05 Cough: Secondary | ICD-10-CM | POA: Diagnosis not present

## 2019-08-04 LAB — CBC WITH DIFFERENTIAL/PLATELET
Abs Immature Granulocytes: 0.02 10*3/uL (ref 0.00–0.07)
Basophils Absolute: 0 10*3/uL (ref 0.0–0.1)
Basophils Relative: 0 %
Eosinophils Absolute: 0 10*3/uL (ref 0.0–0.5)
Eosinophils Relative: 0 %
HCT: 44.7 % (ref 39.0–52.0)
Hemoglobin: 15.2 g/dL (ref 13.0–17.0)
Immature Granulocytes: 0 %
Lymphocytes Relative: 20 %
Lymphs Abs: 1 10*3/uL (ref 0.7–4.0)
MCH: 29.8 pg (ref 26.0–34.0)
MCHC: 34 g/dL (ref 30.0–36.0)
MCV: 87.6 fL (ref 80.0–100.0)
Monocytes Absolute: 0.4 10*3/uL (ref 0.1–1.0)
Monocytes Relative: 8 %
Neutro Abs: 3.6 10*3/uL (ref 1.7–7.7)
Neutrophils Relative %: 72 %
Platelets: 197 10*3/uL (ref 150–400)
RBC: 5.1 MIL/uL (ref 4.22–5.81)
RDW: 12.2 % (ref 11.5–15.5)
WBC: 5.1 10*3/uL (ref 4.0–10.5)
nRBC: 0 % (ref 0.0–0.2)

## 2019-08-04 LAB — COMPREHENSIVE METABOLIC PANEL
ALT: 21 U/L (ref 0–44)
AST: 28 U/L (ref 15–41)
Albumin: 3.6 g/dL (ref 3.5–5.0)
Alkaline Phosphatase: 30 U/L — ABNORMAL LOW (ref 38–126)
Anion gap: 13 (ref 5–15)
BUN: 22 mg/dL (ref 8–23)
CO2: 28 mmol/L (ref 22–32)
Calcium: 9.4 mg/dL (ref 8.9–10.3)
Chloride: 93 mmol/L — ABNORMAL LOW (ref 98–111)
Creatinine, Ser: 1.47 mg/dL — ABNORMAL HIGH (ref 0.61–1.24)
GFR calc Af Amer: 58 mL/min — ABNORMAL LOW (ref >60)
GFR calc non Af Amer: 50 mL/min — ABNORMAL LOW (ref >60)
Glucose, Bld: 102 mg/dL — ABNORMAL HIGH (ref 70–99)
Potassium: 3.2 mmol/L — ABNORMAL LOW (ref 3.5–5.1)
Sodium: 134 mmol/L — ABNORMAL LOW (ref 135–145)
Total Bilirubin: 1 mg/dL (ref 0.3–1.2)
Total Protein: 7.6 g/dL (ref 6.5–8.1)

## 2019-08-04 LAB — LACTIC ACID, PLASMA
Lactic Acid, Venous: 1 mmol/L (ref 0.5–1.9)
Lactic Acid, Venous: 1.2 mmol/L (ref 0.5–1.9)

## 2019-08-04 LAB — URINALYSIS, ROUTINE W REFLEX MICROSCOPIC
Bacteria, UA: NONE SEEN
Bilirubin Urine: NEGATIVE
Glucose, UA: NEGATIVE mg/dL
Hgb urine dipstick: NEGATIVE
Ketones, ur: NEGATIVE mg/dL
Leukocytes,Ua: NEGATIVE
Nitrite: NEGATIVE
Protein, ur: 30 mg/dL — AB
Specific Gravity, Urine: 1.024 (ref 1.005–1.030)
pH: 5 (ref 5.0–8.0)

## 2019-08-04 LAB — POC SARS CORONAVIRUS 2 AG -  ED: SARS Coronavirus 2 Ag: POSITIVE — AB

## 2019-08-04 MED ORDER — IBUPROFEN 800 MG PO TABS
800.0000 mg | ORAL_TABLET | Freq: Once | ORAL | Status: AC
  Administered 2019-08-04: 800 mg via ORAL
  Filled 2019-08-04: qty 1

## 2019-08-04 MED ORDER — ACETAMINOPHEN 325 MG PO TABS
650.0000 mg | ORAL_TABLET | Freq: Once | ORAL | Status: AC | PRN
  Administered 2019-08-04: 650 mg via ORAL
  Filled 2019-08-04: qty 2

## 2019-08-04 MED ORDER — SODIUM CHLORIDE 0.9 % IV BOLUS
1000.0000 mL | Freq: Once | INTRAVENOUS | Status: AC
  Administered 2019-08-04: 22:00:00 1000 mL via INTRAVENOUS

## 2019-08-04 MED ORDER — SODIUM CHLORIDE 0.9% FLUSH
3.0000 mL | Freq: Once | INTRAVENOUS | Status: AC
  Administered 2019-08-04: 3 mL via INTRAVENOUS

## 2019-08-04 NOTE — ED Triage Notes (Signed)
C/o diarrhea, fever, cough (mostly non-productive sometimes clear phlegm), and generalized body aches x 2 weeks.  PCP started him on allergy medication x 1 week and then on Z-pack after no improvement.  Has not been COVID tested.

## 2019-08-04 NOTE — Telephone Encounter (Signed)
Patient states that he has 2 days of his Zpak left. Has a fever of 102, unable to sleep now for 4 days. Hasn't started the prednisone yet. Please advise.

## 2019-08-04 NOTE — ED Provider Notes (Signed)
[facility] EMERGENCY DEPARTMENT Provider Note   CSN: [id number] Arrival date & time: 08/04/19  1747     History Chief Complaint  Patient presents with  . ? COVID  . Fever  . Cough  . Generalized Body Aches  . Diarrhea    EDVARDO HONSE is a 64 y.o. male.  HPI 64 year old male with a history of hypertension, hyperlipidemia, obesity, GERD presents to the ER with a 2-week history of fever, chills, nonbloody diarrhea, nausea but no vomiting, nonproductive cough, generalized body aches x2 weeks.  Patient was seen by the PCP approximately week ago and began to be treated for allergies.  Patient did not improve, patient was given a Z-Pak but he has continued to be symptomatic.  He does not have a history of respiratory illnesses.  No known sick contacts.     Past Medical History:  Diagnosis Date  . Hyperlipidemia   . Hypertension Feb 2017  . Prediabetes Feb 2017   . Vitamin D deficiency disease     Patient Active Problem List   Diagnosis Date Noted  . Former smoker (quit 2003, 66 pack year history) 08/01/2019  . Seasonal allergic rhinitis 08/01/2019  . Abnormal glucose (hx of prediabetes) 12/03/2018  . FHx: colon cancer 12/03/2018  . HTN (hypertension) 05/25/2015  . Hyperlipidemia, mixed 05/25/2015  . Obesity (BMI 30.0-34.9) 05/25/2015  . Vitamin D deficiency 05/25/2015  . Medication management 05/25/2015  . GERD (gastroesophageal reflux disease) 05/25/2015    History reviewed. No pertinent surgical history.     Family History  Problem Relation Age of Onset  . Cancer Mother   . Hypertension Mother   . Hypertension Father   . Heart disease Father     Social History   Tobacco Use  . Smoking status: Former Smoker    Packs/day: 2.00    Years: 33.00    Pack years: 66.00    Types: Cigarettes    Start date: 55    Quit date: 04/07/2001    Years since quitting: 18.3  . Smokeless tobacco: Never Used  Substance Use Topics  . Alcohol use: Yes     Alcohol/week: 24.0 standard drinks    Types: 24 Standard drinks or equivalent per week    Comment: 1 case of beer and 2 shots of vodka  . Drug use: Not Currently    Home Medications Prior to Admission medications   Medication Sig Start Date End Date Taking? Authorizing Provider  aspirin EC 81 MG tablet Take 81 mg by mouth daily.    [provider]  atenolol (TENORMIN) 100 MG tablet Take 1 tablet Daily for BP 01/13/19   Unk Pinto, MD  azithromycin (ZITHROMAX) 250 MG tablet Take 2 tablets with Food on  Day 1, then 1 tablet Daily with Food for Infection 08/02/19   Unk Pinto, MD  Cholecalciferol (VITAMIN D PO) Take 5,000 Units by mouth daily.    [provider]  fexofenadine (ALLEGRA) 180 MG tablet Take 1 tablet (180 mg total) by mouth daily. 08/01/19   Liane Comber, NP  hydrochlorothiazide (HYDRODIURIL) 25 MG tablet Take 1 tablet Daily for BP & Fluid Retention / Ankle Swelling 01/13/19   Unk Pinto, MD  losartan (COZAAR) 50 MG tablet Take 1 tablet Daily for BP 11/08/18   Unk Pinto, MD  Magnesium 500 MG TABS Take 1 tablet by mouth daily.    [provider]  minoxidil (LONITEN) 10 MG tablet TAKE 1/2 TO 1 TABLET BY MOUTH EVERY DAY  AS NEEDED FOR BLOOD PRESSURE 08/28/18   Elder Negus, NP  Multiple Vitamins-Minerals (HAIR SKIN AND NAILS FORMULA PO) Take by mouth daily.    [provider]  Omeprazole Magnesium (PRILOSEC OTC PO) Take 20 mg by mouth daily.    [provider]  predniSONE (DELTASONE) 20 MG tablet 2 tablets daily for 3 days, 1 tablet daily for 4 days. 08/01/19   Judd Gaudier, NP  Tadalafil (CIALIS PO) Take by mouth as needed.    [provider]    Allergies    Lisinopril and Penicillins  Review of Systems   Review of Systems  Constitutional: Positive for chills and fatigue.  HENT: Positive for sinus pressure and sinus pain. Negative for congestion, sore throat, trouble swallowing and voice change.    Eyes: Negative for pain, redness and visual disturbance.  Respiratory: Positive for cough and shortness of breath.   Cardiovascular: Negative for chest pain, palpitations and leg swelling.  Gastrointestinal: Positive for diarrhea and nausea. Negative for abdominal pain, rectal pain and vomiting.  Genitourinary: Negative for dysuria, flank pain and hematuria.  Musculoskeletal: Negative for back pain, neck pain and neck stiffness.  Skin: Negative for rash.  Neurological: Positive for headaches. Negative for dizziness, syncope and weakness.  Psychiatric/Behavioral: Negative for confusion.    Physical Exam Updated Vital Signs BP 125/74   Pulse 92   Temp (!) 101.8 F (38.8 C) (Oral)   Resp (!) 29   SpO2 91%   Physical Exam Vitals and nursing note reviewed.  Constitutional:      General: He is not in acute distress.    Appearance: Normal appearance. He is well-developed and normal weight. He is not ill-appearing, toxic-appearing or diaphoretic.  HENT:     Head: Normocephalic and atraumatic.     Right Ear: Tympanic membrane normal.     Left Ear: Tympanic membrane normal.     Nose: Nose normal.     Mouth/Throat:     Mouth: Mucous membranes are dry.  Eyes:     Extraocular Movements: Extraocular movements intact.     Conjunctiva/sclera: Conjunctivae normal.     Pupils: Pupils are equal, round, and reactive to light.  Cardiovascular:     Rate and Rhythm: Normal rate and regular rhythm.     Pulses: Normal pulses.     Heart sounds: Normal heart sounds. No murmur.  Pulmonary:     Effort: Pulmonary effort is normal. No respiratory distress.     Breath sounds: Normal breath sounds.  Abdominal:     General: Abdomen is flat.     Palpations: Abdomen is soft.     Tenderness: There is no abdominal tenderness.  Musculoskeletal:        General: No swelling or tenderness. Normal range of motion.     Cervical back: Normal range of motion and neck supple.     Right lower leg: No edema.      Left lower leg: No edema.  Skin:    General: Skin is warm and dry.     Capillary Refill: Capillary refill takes less than 2 seconds.     Findings: No erythema or rash.  Neurological:     General: No focal deficit present.     Mental Status: He is alert and oriented to person, place, and time.  Psychiatric:        Mood and Affect: Mood normal.        Behavior: Behavior normal.     ED Results / Procedures / Treatments  Labs (all labs ordered are listed, but only abnormal results are displayed) Labs Reviewed  COMPREHENSIVE METABOLIC PANEL - Abnormal; Notable for the following components:      Result Value   Sodium 134 (*)    Potassium 3.2 (*)    Chloride 93 (*)    Glucose, Bld 102 (*)    Creatinine, Ser 1.47 (*)    Alkaline Phosphatase 30 (*)    GFR calc non Af Amer 50 (*)    GFR calc Af Amer 58 (*)    All other components within normal limits  URINALYSIS, ROUTINE W REFLEX MICROSCOPIC - Abnormal; Notable for the following components:   Protein, ur 30 (*)    All other components within normal limits  POC SARS CORONAVIRUS 2 AG -  ED - Abnormal; Notable for the following components:   SARS Coronavirus 2 Ag POSITIVE (*)    All other components within normal limits  LACTIC ACID, PLASMA  LACTIC ACID, PLASMA  CBC WITH DIFFERENTIAL/PLATELET    EKG None  Radiology DG Chest Portable 1 View  Result Date: 08/04/2019 CLINICAL DATA:  Cough. EXAM: PORTABLE CHEST 1 VIEW COMPARISON:  None. FINDINGS: Borderline cardiomegaly. Normal mediastinal contours. There are patchy bilateral heterogeneous opacities in a mid lower lung zone predominant distribution. No evidence of pulmonary edema, large pleural effusion, or pneumothorax. No acute osseous abnormalities are seen. IMPRESSION: Patchy bilateral heterogeneous opacities in a mid lower lung zone predominant distribution, consistent with multifocal pneumonia, the pattern is typical of COVID-19. Electronically Signed   By: Narda Rutherford M.D.    On: 08/04/2019 22:19    Procedures Procedures (including critical care time)  Medications Ordered in ED Medications  sodium chloride flush (NS) 0.9 % injection 3 mL (3 mLs Intravenous Given 08/04/19 2217)  acetaminophen (TYLENOL) tablet 650 mg (650 mg Oral Given 08/04/19 1847)  sodium chloride 0.9 % bolus 1,000 mL (0 mLs Intravenous Stopped 08/04/19 2351)  ibuprofen (ADVIL) tablet 800 mg (800 mg Oral Given 08/04/19 2216)    ED Course  I have reviewed the triage vital signs and the nursing notes.  Pertinent labs & imaging results that were available during my care of the patient were reviewed by me and considered in my medical decision making (see chart for details).    MDM Rules/Calculators/A&P                     Patient to the ER with fever, chills, nonproductive cough, body aches, diarrhea x2 weeks. On presentation to the ER, patient is alert and oriented, in no acute distress, without increased work of breathing, nondiaphoretic.  He is febrile in the ER with a temp as high as 103.  Treated with Tylenol and Advil, improved to 101.8.  He is mildly tachypneic, but O2 sats between 95 and 97.  He does not appear to have increased work of breathing.  Speaking in full sentences.  CMP with mild decrease in electrolytes, increased glucose, increased creatinine likely in the setting of dehydration.  Lactic normal.  UA without evidence of UTI.  CBC without leukocytosis, normal hemoglobin.  Chest x-ray shows patchy bilateral opacities in the mid lower lung zone consistent with multifocal pneumonia in the pattern  typical for COVID-19.  15-minute Covid positive.  Patient received 1 L fluid bolus in the ER.  We will ambulate the patient and monitor O2 sats, but I suspect that he will be stable for discharge.  Nursing staff ambulated the patient, patient sats between 94 to  96% at rest, 91% with ambulation with no dyspnea.  Patient is not acutely ill appearing, not visibly tachypneic, and feels overall  stable on reevaluation.  At this stage in the ED course, the patient appears stable for discharge.  I discussed these findings with the patient and he is agreeable to this.  Strict return precautions given, I suggested that the patient buy a home pulse ox and monitor his oxygen levels.  Return to the ER if his levels drop below 90%.  Encouraged follow-up with the PCP within the week.  I discussed the case with Dr. Stevie Kern and he is agreeable to this plan.   Final Clinical Impression(s) / ED Diagnoses Final diagnoses:  COVID-19    Rx / DC Orders ED Discharge Orders    None       Mare Ferrari, PA-C 08/05/19 0000    Milagros Loll, MD 08/05/19 1945

## 2019-08-04 NOTE — ED Notes (Signed)
PA Belaya informed pt POC covid pos

## 2019-08-04 NOTE — Discharge Instructions (Addendum)
Your COVID test is positive.  Follow CDC guidelines and quarantine, wear a mask, wash hands often. You may buy a pulse oximeter at your local pharmacy to monitor your oxygen saturations. If they drop below 90 consistently return to the ER. Treat your symptoms with Tylenol? Ibuprofen, lots of fluids.   Please take over the counter vitamin D 2000-4000 units per day. I also recommend zinc 50 mg per day for the next two weeks.   Please return to ED if you feel have difficulty breathing or have emergent, new or concerning symptoms.  Patients who have symptoms consistent with COVID-19 should self isolated for: At least 3 days (72 hours) have passed since recovery, defined as resolution of fever without the use of fever reducing medications and improvement in respiratory symptoms (e.g., cough, shortness of breath), and At least 7 days have passed since symptoms first appeared.       Person Under Monitoring Name: Wesley Oneal  Location: [address]  [place] [address]   Infection Prevention Recommendations for Individuals Confirmed to have, or Being Evaluated for, 2019 Novel Coronavirus (COVID-19) Infection Who Receive Care at Home  Individuals who are confirmed to have, or are being evaluated for, COVID-19 should follow the prevention steps below until a healthcare provider or local or state health department says they can return to normal activities.  Stay home except to get medical care You should restrict activities outside your home, except for getting medical care. Do not go to work, school, or public areas, and do not use public transportation or taxis.  Call ahead before visiting your doctor Before your medical appointment, call the healthcare provider and tell them that you have, or are being evaluated for, COVID-19 infection. This will help the healthcare provider's office take steps to keep other people from getting infected. Ask your healthcare provider to call the local  or state health department.  Monitor your symptoms Seek prompt medical attention if your illness is worsening (e.g., difficulty breathing). Before going to your medical appointment, call the healthcare provider and tell them that you have, or are being evaluated for, COVID-19 infection. Ask your healthcare provider to call the local or state health department.  Wear a facemask You should wear a facemask that covers your nose and mouth when you are in the same room with other people and when you visit a healthcare provider. People who live with or visit you should also wear a facemask while they are in the same room with you.  Separate yourself from other people in your home As much as possible, you should stay in a different room from other people in your home. Also, you should use a separate bathroom, if available.  Avoid sharing household items You should not share dishes, drinking glasses, cups, eating utensils, towels, bedding, or other items with other people in your home. After using these items, you should wash them thoroughly with soap and water.  Cover your coughs and sneezes Cover your mouth and nose with a tissue when you cough or sneeze, or you can cough or sneeze into your sleeve. Throw used tissues in a lined trash can, and immediately wash your hands with soap and water for at least 20 seconds or use an alcohol-based hand rub.  Wash your [employer] your hands often and thoroughly with soap and water for at least 20 seconds. You can use an alcohol-based hand sanitizer if soap and water are not available and if your hands are not  visibly dirty. Avoid touching your eyes, nose, and mouth with unwashed hands.   Prevention Steps for Caregivers and Household Members of Individuals Confirmed to have, or Being Evaluated for, COVID-19 Infection Being Cared for in the Home  If you live with, or provide care at home for, a person confirmed to have, or being evaluated for,  COVID-19 infection please follow these guidelines to prevent infection:  Follow healthcare provider's instructions Make sure that you understand and can help the patient follow any healthcare provider instructions for all care.  Provide for the patient's basic needs You should help the patient with basic needs in the home and provide support for getting groceries, prescriptions, and other personal needs.  Monitor the patient's symptoms If they are getting sicker, call his or her medical provider and tell them that the patient has, or is being evaluated for, COVID-19 infection. This will help the healthcare provider's office take steps to keep other people from getting infected. Ask the healthcare provider to call the local or state health department.  Limit the number of people who have contact with the patient If possible, have only one caregiver for the patient. Other household members should stay in another home or place of residence. If this is not possible, they should stay in another room, or be separated from the patient as much as possible. Use a separate bathroom, if available. Restrict visitors who do not have an essential need to be in the home.  Keep older adults, very young children, and other sick people away from the patient Keep older adults, very young children, and those who have compromised immune systems or chronic health conditions away from the patient. This includes people with chronic heart, lung, or kidney conditions, diabetes, and cancer.  Ensure good ventilation Make sure that shared spaces in the home have good air flow, such as from an air conditioner or an opened window, weather permitting.  Wash your hands often Wash your hands often and thoroughly with soap and water for at least 20 seconds. You can use an alcohol based hand sanitizer if soap and water are not available and if your hands are not visibly dirty. Avoid touching your eyes, nose, and mouth  with unwashed hands. Use disposable paper towels to dry your hands. If not available, use dedicated cloth towels and replace them when they become wet.  Wear a facemask and gloves Wear a disposable facemask at all times in the room and gloves when you touch or have contact with the patient's blood, body fluids, and/or secretions or excretions, such as sweat, saliva, sputum, nasal mucus, vomit, urine, or feces.  Ensure the mask fits over your nose and mouth tightly, and do not touch it during use. Throw out disposable facemasks and gloves after using them. Do not reuse. Wash your hands immediately after removing your facemask and gloves. If your personal clothing becomes contaminated, carefully remove clothing and launder. Wash your hands after handling contaminated clothing. Place all used disposable facemasks, gloves, and other waste in a lined container before disposing them with other household waste. Remove gloves and wash your hands immediately after handling these items.  Do not share dishes, glasses, or other household items with the patient Avoid sharing household items. You should not share dishes, drinking glasses, cups, eating utensils, towels, bedding, or other items with a patient who is confirmed to have, or being evaluated for, COVID-19 infection. After the person uses these items, you should wash them thoroughly with soap and water.  Wash laundry thoroughly Immediately remove and wash clothes or bedding that have blood, body fluids, and/or secretions or excretions, such as sweat, saliva, sputum, nasal mucus, vomit, urine, or feces, on them. Wear gloves when handling laundry from the patient. Read and follow directions on labels of laundry or clothing items and detergent. In general, wash and dry with the warmest temperatures recommended on the label.  Clean all areas the individual has used often Clean all touchable surfaces, such as counters, tabletops, doorknobs, bathroom  fixtures, toilets, phones, keyboards, tablets, and bedside tables, every day. Also, clean any surfaces that may have blood, body fluids, and/or secretions or excretions on them. Wear gloves when cleaning surfaces the patient has come in contact with. Use a diluted bleach solution (e.g., dilute bleach with 1 part bleach and 10 parts water) or a household disinfectant with a label that says EPA-registered for coronaviruses. To make a bleach solution at home, add 1 tablespoon of bleach to 1 quart (4 cups) of water. For a larger supply, add  cup of bleach to 1 gallon (16 cups) of water. Read labels of cleaning products and follow recommendations provided on product labels. Labels contain instructions for safe and effective use of the cleaning product including precautions you should take when applying the product, such as wearing gloves or eye protection and making sure you have good ventilation during use of the product. Remove gloves and wash hands immediately after cleaning.  Monitor yourself for signs and symptoms of illness Caregivers and household members are considered close contacts, should monitor their health, and will be asked to limit movement outside of the home to the extent possible. Follow the monitoring steps for close contacts listed on the symptom monitoring form.   ? If you have additional questions, contact your local health department or call the epidemiologist on call at [phone] (available 24/7). ? This guidance is subject to change. For the most up-to-date guidance from [facility], please refer to their website: YouBlogs.pl

## 2019-08-05 NOTE — ED Notes (Signed)
Patient verbalizes understanding of discharge instructions. Opportunity for questioning and answers were provided. Armband removed by staff, pt discharged from ED ambulatory w/ wife 

## 2019-08-08 ENCOUNTER — Other Ambulatory Visit: Payer: Self-pay | Admitting: Adult Health Nurse Practitioner

## 2019-11-06 ENCOUNTER — Other Ambulatory Visit: Payer: Self-pay | Admitting: Internal Medicine

## 2019-11-06 DIAGNOSIS — I10 Essential (primary) hypertension: Secondary | ICD-10-CM

## 2019-11-06 MED ORDER — LOSARTAN POTASSIUM 50 MG PO TABS: ORAL_TABLET | ORAL | 0 refills | Status: DC

## 2020-01-04 ENCOUNTER — Encounter: Payer: BLUE CROSS/BLUE SHIELD | Admitting: Internal Medicine

## 2020-01-13 ENCOUNTER — Telehealth: Payer: Self-pay

## 2020-01-13 DIAGNOSIS — I10 Essential (primary) hypertension: Secondary | ICD-10-CM

## 2020-01-13 MED ORDER — HYDROCHLOROTHIAZIDE 25 MG PO TABS: ORAL_TABLET | ORAL | 3 refills | Status: DC

## 2020-01-13 MED ORDER — ATENOLOL 100 MG PO TABS: ORAL_TABLET | ORAL | 3 refills | Status: DC

## 2020-01-13 NOTE — Telephone Encounter (Signed)
Refill requests for HCTZ and Atenolol

## 2020-02-06 ENCOUNTER — Other Ambulatory Visit: Payer: Self-pay | Admitting: Internal Medicine

## 2020-02-07 ENCOUNTER — Other Ambulatory Visit: Payer: Self-pay | Admitting: Internal Medicine

## 2020-02-07 ENCOUNTER — Other Ambulatory Visit: Payer: Self-pay | Admitting: Adult Health

## 2020-02-07 DIAGNOSIS — I10 Essential (primary) hypertension: Secondary | ICD-10-CM

## 2020-04-17 ENCOUNTER — Encounter: Payer: Self-pay | Admitting: Internal Medicine

## 2020-04-17 NOTE — Patient Instructions (Signed)

## 2020-04-17 NOTE — Progress Notes (Signed)
Annual  Screening/Preventative Visit  & Comprehensive Evaluation & Examination     [name] presents for a Screening /Preventative Visit & comprehensive evaluation and management of multiple medical co-morbidities.  Patient has been followed for HTN, HLD, Prediabetes and Vitamin D Deficiency. Patient has GERD controlled with diet & his meds.      HTN predates since 2017. Patient's BP has been controlled at home.  Today's  . Patient denies any cardiac symptoms as chest pain, palpitations, shortness of breath, dizziness or ankle swelling.      Patient's hyperlipidemia is controlled with diet and medications. Patient denies myalgias or other medication SE's. Last lipids were not at goal:  Lab Results  Component Value Date   CHOL 178 03/08/2019   HDL 44 03/08/2019   LDLCALC 105 (H) 03/08/2019   TRIG 172 (H) 03/08/2019   CHOLHDL 4.0 03/08/2019        Patient has Moderate Obesity (BMI 33+) and hx/o prediabetes(A1c 6.1% /2017) and patient denies reactive hypoglycemic symptoms, visual blurring, diabetic polys or paresthesias. Last A1c was normal & at goal:   Lab Results  Component Value Date   HGBA1C 5.6 12/02/2018         Finally, patient has history of Vitamin D Deficiency ("37" /2010) and last vitamin D was at goal:   Lab Results  Component Value Date   VD25OH 71 12/02/2018    Current Outpatient Medications on File Prior to Visit  Medication Sig  . aspirin EC 81 MG tablet Take daily.  Marland Kitchen atenolol  100 MG tablet Take 1 tablet Daily   . VITAMIN D 5,000 Units Take  daily.  . fexofenadine  180 MG tablet Take 1 tablet ( daily.  . hctz 25 MG tablet Take 1 tablet Daily   . losartan  50 MG tablet Take     1 tablet     Daily      . Magnesium 500 MG TABS Take 1 tablet  daily.  . Minoxidil 10 MG tablet Take  1/2 to 1 tabletDaily  . Multiple Vitamins-Minerals Take daily.  . Omeprazole OTC  Take 20 mg  daily.  . tadalafil (CIALIS) 20 MG tablet Take  1/2 to 1 tablet   every 2 to 3 day if needed     Allergies  Allergen Reactions  . Lisinopril Cough  . Penicillins     Past Medical History:  Diagnosis Date  . Hyperlipidemia   . Hypertension Feb 2017  . Prediabetes Feb 2017   . Vitamin D deficiency disease    Health Maintenance  Topic Date Due  . Hepatitis C Screening  Never done  . COVID-19 Vaccine (1) Never done  . HIV Screening  Never done  . COLONOSCOPY (Pts 45-11yrs Insurance coverage will need to be confirmed)  Never done  . INFLUENZA VACCINE  Never done  . TETANUS/TDAP  09/05/2024   Immunization History  Administered Date(s) Administered  . PPD Test 05/25/2015, 06/25/2016, 12/02/2018  . Td 09/06/2014   Last Colon - Never, despite hx/o (+) colon ca in his mother  No past surgical history on file.  Social History   Socioeconomic History  . Marital status: Married    Spouse name: Eunice Blase  . Number of children: 1 son & 1 daughter  Occupational History  . retired  Tobacco Use  . Smoking status: Former Smoker    Packs/day: 2.00    Years: 33.00    Pack years: 66.00    Types:  Cigarettes    Start date: 73    Quit date: 04/07/2001    Years since quitting: 19.0  . Smokeless tobacco: Never Used  Substance and Sexual Activity  . Alcohol use: Yes    Alcohol/week: 24.0 standard drinks    Types: 24 Standard drinks or equivalent per week    Comment: 1 case of beer and 2 shots of vodka  . Drug use: Not Currently    ROS Constitutional: Denies fever, chills, weight loss/gain, headaches, insomnia,  night sweats or change in appetite. Does c/o fatigue. Eyes: Denies redness, blurred vision, diplopia, discharge, itchy or watery eyes.  ENT: Denies discharge, congestion, post nasal drip, epistaxis, sore throat, earache, hearing loss, dental pain, Tinnitus, Vertigo, Sinus pain or snoring.  Cardio: Denies chest pain, palpitations, irregular heartbeat, syncope, dyspnea, diaphoresis, orthopnea, PND, claudication or edema Respiratory: denies  cough, dyspnea, DOE, pleurisy, hoarseness, laryngitis or wheezing.  Gastrointestinal: Denies dysphagia, heartburn, reflux, water brash, pain, cramps, nausea, vomiting, bloating, diarrhea, constipation, hematemesis, melena, hematochezia, jaundice or hemorrhoids Genitourinary: Denies dysuria, frequency, discharge, hematuria or flank pain. Has urgency, nocturia x 2-3 & occasional hesitancy. Musculoskeletal: Denies arthralgia, myalgia, stiffness, Jt. Swelling, pain, limp or strain/sprain. Denies Falls. Skin: Denies puritis, rash, hives, warts, acne, eczema or change in skin lesion Neuro: No weakness, tremor, incoordination, spasms, paresthesia or pain Psychiatric: Denies confusion, memory loss or sensory loss. Denies Depression. Endocrine: Denies change in weight, skin, hair change, nocturia, and paresthesia, diabetic polys, visual blurring or hyper / hypo glycemic episodes.  Heme/Lymph: No excessive bleeding, bruising or enlarged lymph nodes.  Physical Exam  There were no vitals taken for this visit.  General Appearance: Well nourished and well groomed and in no apparent distress.  Eyes: PERRLA, EOMs, conjunctiva no swelling or erythema, normal fundi and vessels. Sinuses: No frontal/maxillary tenderness ENT/Mouth: EACs patent / TMs  nl. Nares clear without erythema, swelling, mucoid exudates. Oral hygiene is good. No erythema, swelling, or exudate. Tongue normal, non-obstructing. Tonsils not swollen or erythematous. Hearing normal.  Neck: Supple, thyroid not palpable. No bruits, nodes or JVD. Respiratory: Respiratory effort normal.  BS equal and clear bilateral without rales, rhonci, wheezing or stridor. Cardio: Heart sounds are normal with regular rate and rhythm and no murmurs, rubs or gallops. Peripheral pulses are normal and equal bilaterally without edema. No aortic or femoral bruits. Chest: symmetric with normal excursions and percussion.  Abdomen: Soft, with Nl bowel sounds. Nontender, no  guarding, rebound, hernias, masses, or organomegaly.  Lymphatics: Non tender without lymphadenopathy.  Musculoskeletal: Full ROM all peripheral extremities, joint stability, 5/5 strength, and normal gait. Skin: Warm and dry without rashes, lesions, cyanosis, clubbing or  ecchymosis.  Neuro: Cranial nerves intact, reflexes equal bilaterally. Normal muscle tone, no cerebellar symptoms. Sensation intact.  Pysch: Alert and oriented X 3 with normal affect, insight and judgment appropriate.   Assessment and Plan  1. Annual Preventative/Screening Exam    2. Essential hypertension  - EKG 12-Lead - [place], RETROPERITNL ABD,  LTD - Urinalysis, Routine w reflex microscopic - Microalbumin / creatinine urine ratio - CBC with Differential/Platelet - COMPLETE METABOLIC PANEL WITH GFR - Magnesium - TSH  3. Hyperlipidemia, mixed  - EKG 12-Lead - [place], RETROPERITNL ABD,  LTD - Lipid panel - TSH  4. Abnormal glucose  - EKG 12-Lead - [place], RETROPERITNL ABD,  LTD - Hemoglobin A1c - Insulin, random  5. Vitamin D deficiency  - VITAMIN D 25 Hydroxy   6. Gastroesophageal reflux disease  - CBC with Differential/Platelet  7. BPH with obstruction/lower urinary tract symptoms  - PSA  8. Screening examination for pulmonary tuberculosis  - Testosterone - TB Skin Test  9. Screening for colorectal cancer  - POC Hemoccult Bld/Stl   10. Screening for ischemic heart disease = - EKG 12-Lead  11. Prostate cancer screening  - PSA  12. FHx: heart disease  - EKG 12-Lead - [place], RETROPERITNL ABD,  LTD  13. Former smoker  - EKG 12-Lead - [place], RETROPERITNL ABD,  LTD  14. Screening for AAA (aortic abdominal aneurysm)  - [place], RETROPERITNL ABD,  LTD  15. Fatigue, unspecified type  - Iron,Total/Total Iron Binding Cap - Vitamin B12 - Testosterone - CBC with Differential/Platelet - TSH  16. Medication management  - Urinalysis, Routine w reflex microscopic - Microalbumin / creatinine  urine ratio - CBC with Differential/Platelet - COMPLETE METABOLIC PANEL WITH GFR - Magnesium - Lipid panel - TSH - Hemoglobin A1c - Insulin, random - VITAMIN D 25 Hydroxy          Patient was counseled in prudent diet, weight control to achieve/maintain BMI less than 25, BP monitoring, regular exercise and medications as discussed.  Discussed med effects and SE's. Routine screening labs and tests as requested with regular follow-up as recommended. Over 40 minutes of exam, counseling, chart review and high complex critical decision making was performed   Marinus Maw, MD

## 2020-04-18 ENCOUNTER — Other Ambulatory Visit: Payer: Self-pay

## 2020-04-18 ENCOUNTER — Ambulatory Visit: Payer: BC Managed Care – PPO | Admitting: Internal Medicine

## 2020-04-18 VITALS — BP 126/74 | HR 76 | Temp 97.9°F | Resp 16 | Ht 68.5 in | Wt 235.0 lb

## 2020-04-18 DIAGNOSIS — Z8249 Family history of ischemic heart disease and other diseases of the circulatory system: Secondary | ICD-10-CM

## 2020-04-18 DIAGNOSIS — N401 Enlarged prostate with lower urinary tract symptoms: Secondary | ICD-10-CM

## 2020-04-18 DIAGNOSIS — Z79899 Other long term (current) drug therapy: Secondary | ICD-10-CM | POA: Diagnosis not present

## 2020-04-18 DIAGNOSIS — I10 Essential (primary) hypertension: Secondary | ICD-10-CM

## 2020-04-18 DIAGNOSIS — K219 Gastro-esophageal reflux disease without esophagitis: Secondary | ICD-10-CM

## 2020-04-18 DIAGNOSIS — E559 Vitamin D deficiency, unspecified: Secondary | ICD-10-CM

## 2020-04-18 DIAGNOSIS — R350 Frequency of micturition: Secondary | ICD-10-CM | POA: Diagnosis not present

## 2020-04-18 DIAGNOSIS — E782 Mixed hyperlipidemia: Secondary | ICD-10-CM

## 2020-04-18 DIAGNOSIS — Z13220 Encounter for screening for lipoid disorders: Secondary | ICD-10-CM | POA: Diagnosis not present

## 2020-04-18 DIAGNOSIS — R7309 Other abnormal glucose: Secondary | ICD-10-CM

## 2020-04-18 DIAGNOSIS — Z130 Encounter for screening for diseases of the blood and blood-forming organs and certain disorders involving the immune mechanism: Secondary | ICD-10-CM

## 2020-04-18 DIAGNOSIS — Z1329 Encounter for screening for other suspected endocrine disorder: Secondary | ICD-10-CM | POA: Diagnosis not present

## 2020-04-18 DIAGNOSIS — Z1389 Encounter for screening for other disorder: Secondary | ICD-10-CM | POA: Diagnosis not present

## 2020-04-18 DIAGNOSIS — Z131 Encounter for screening for diabetes mellitus: Secondary | ICD-10-CM | POA: Diagnosis not present

## 2020-04-18 DIAGNOSIS — Z136 Encounter for screening for cardiovascular disorders: Secondary | ICD-10-CM

## 2020-04-18 DIAGNOSIS — Z111 Encounter for screening for respiratory tuberculosis: Secondary | ICD-10-CM

## 2020-04-18 DIAGNOSIS — Z0000 Encounter for general adult medical examination without abnormal findings: Secondary | ICD-10-CM

## 2020-04-18 DIAGNOSIS — Z125 Encounter for screening for malignant neoplasm of prostate: Secondary | ICD-10-CM | POA: Diagnosis not present

## 2020-04-18 DIAGNOSIS — Z87891 Personal history of nicotine dependence: Secondary | ICD-10-CM

## 2020-04-18 DIAGNOSIS — N138 Other obstructive and reflux uropathy: Secondary | ICD-10-CM

## 2020-04-18 DIAGNOSIS — Z1211 Encounter for screening for malignant neoplasm of colon: Secondary | ICD-10-CM

## 2020-04-18 DIAGNOSIS — Z0001 Encounter for general adult medical examination with abnormal findings: Secondary | ICD-10-CM

## 2020-04-18 DIAGNOSIS — R5383 Other fatigue: Secondary | ICD-10-CM

## 2020-04-18 MED ORDER — ASPIRIN EC 81 MG PO TBEC: DELAYED_RELEASE_TABLET | ORAL | Status: AC

## 2020-04-18 MED ORDER — TADALAFIL 20 MG PO TABS: ORAL_TABLET | ORAL | 1 refills | Status: DC

## 2020-04-19 LAB — LIPID PANEL
Cholesterol: 185 mg/dL (ref <200)
HDL: 37 mg/dL — ABNORMAL LOW (ref >=40)
LDL Cholesterol (Calc): 113 mg/dL (calc) — ABNORMAL HIGH
Non-HDL Cholesterol (Calc): 148 mg/dL (calc) — ABNORMAL HIGH (ref <130)
Total CHOL/HDL Ratio: 5 (calc) — ABNORMAL HIGH (ref <5.0)
Triglycerides: 232 mg/dL — ABNORMAL HIGH (ref <150)

## 2020-04-19 LAB — MICROALBUMIN / CREATININE URINE RATIO
Creatinine, Urine: 361 mg/dL — ABNORMAL HIGH (ref 20–320)
Microalb Creat Ratio: 4 mcg/mg creat (ref <30)
Microalb, Ur: 1.6 mg/dL

## 2020-04-19 LAB — CBC WITH DIFFERENTIAL/PLATELET
Absolute Monocytes: 583 cells/uL (ref 200–950)
Basophils Absolute: 28 cells/uL (ref 0–200)
Basophils Relative: 0.5 %
Eosinophils Absolute: 132 cells/uL (ref 15–500)
Eosinophils Relative: 2.4 %
HCT: 47.1 % (ref 38.5–50.0)
Hemoglobin: 16 g/dL (ref 13.2–17.1)
Lymphs Abs: 1458 cells/uL (ref 850–3900)
MCH: 30 pg (ref 27.0–33.0)
MCHC: 34 g/dL (ref 32.0–36.0)
MCV: 88.4 fL (ref 80.0–100.0)
MPV: 10.3 fL (ref 7.5–12.5)
Monocytes Relative: 10.6 %
Neutro Abs: 3300 cells/uL (ref 1500–7800)
Neutrophils Relative %: 60 %
Platelets: 207 10*3/uL (ref 140–400)
RBC: 5.33 10*6/uL (ref 4.20–5.80)
RDW: 12.6 % (ref 11.0–15.0)
Total Lymphocyte: 26.5 %
WBC: 5.5 10*3/uL (ref 3.8–10.8)

## 2020-04-19 LAB — COMPLETE METABOLIC PANEL WITH GFR
AG Ratio: 1.8 (calc) (ref 1.0–2.5)
ALT: 19 U/L (ref 9–46)
AST: 16 U/L (ref 10–35)
Albumin: 4.5 g/dL (ref 3.6–5.1)
Alkaline phosphatase (APISO): 39 U/L (ref 35–144)
BUN: 15 mg/dL (ref 7–25)
CO2: 31 mmol/L (ref 20–32)
Calcium: 9.8 mg/dL (ref 8.6–10.3)
Chloride: 98 mmol/L (ref 98–110)
Creat: 1.08 mg/dL (ref 0.70–1.25)
GFR, Est African American: 84 mL/min/{1.73_m2} (ref >=60)
GFR, Est Non African American: 72 mL/min/{1.73_m2} (ref >=60)
Globulin: 2.5 g/dL (calc) (ref 1.9–3.7)
Glucose, Bld: 126 mg/dL — ABNORMAL HIGH (ref 65–99)
Potassium: 4.1 mmol/L (ref 3.5–5.3)
Sodium: 139 mmol/L (ref 135–146)
Total Bilirubin: 0.6 mg/dL (ref 0.2–1.2)
Total Protein: 7 g/dL (ref 6.1–8.1)

## 2020-04-19 LAB — URINALYSIS, ROUTINE W REFLEX MICROSCOPIC
Bilirubin Urine: NEGATIVE
Glucose, UA: NEGATIVE
Hgb urine dipstick: NEGATIVE
Leukocytes,Ua: NEGATIVE
Nitrite: NEGATIVE
Protein, ur: NEGATIVE
Specific Gravity, Urine: 1.031 (ref 1.001–1.03)
pH: <=5 (ref 5.0–8.0)

## 2020-04-19 LAB — VITAMIN B12: Vitamin B-12: 469 pg/mL (ref 200–1100)

## 2020-04-19 LAB — HEMOGLOBIN A1C
Hgb A1c MFr Bld: 5.9 % of total Hgb — ABNORMAL HIGH (ref <5.7)
Mean Plasma Glucose: 123 mg/dL
eAG (mmol/L): 6.8 mmol/L

## 2020-04-19 LAB — IRON, TOTAL/TOTAL IRON BINDING CAP
%SAT: 32 % (calc) (ref 20–48)
Iron: 92 ug/dL (ref 50–180)
TIBC: 291 mcg/dL (calc) (ref 250–425)

## 2020-04-19 LAB — TSH: TSH: 1.26 mIU/L (ref 0.40–4.50)

## 2020-04-19 LAB — MAGNESIUM: Magnesium: 1.9 mg/dL (ref 1.5–2.5)

## 2020-04-19 LAB — INSULIN, RANDOM: Insulin: 44.4 u[IU]/mL — ABNORMAL HIGH

## 2020-04-19 LAB — TESTOSTERONE: Testosterone: 397 ng/dL (ref 250–827)

## 2020-04-19 LAB — VITAMIN D 25 HYDROXY (VIT D DEFICIENCY, FRACTURES): Vit D, 25-Hydroxy: 70 ng/mL (ref 30–100)

## 2020-04-19 LAB — PSA: PSA: 1.16 ng/mL (ref <=4.0)

## 2020-04-19 NOTE — Progress Notes (Signed)
========================================================== -   Test results slightly outside the reference range are not unusual. If there is anything important, I will review this with you,  otherwise it is considered normal test values.  If you have further questions,  please do not hesitate to contact me at the office or via My Chart.  ========================================================== ==========================================================  -  Iron & Vitamin B12 levels - Both Normal &  OK  ========================================================== ==========================================================  -  PSA - Low - Great  ========================================================== ==========================================================  -  Testosterone Level - Normal  =========================================================== ===========================================================  -  Total Chol = 185 - OK(Ideal or Goal is less than 180   - and LDL Chol = 113 - elevated (Ideal or Goal is less than 70)   - So . . . . . . - Recommend a stricter  low cholesterol diet   - Cholesterol only comes from animal sources  - ie. meat, dairy, egg yolks  - Eat all the vegetables you want.  - Avoid meat, especially red meat - Beef AND Pork .  - Avoid cheese & dairy - milk & ice cream.     - Cheese is the most concentrated form of trans-fats which  is the worst thing to clog up our arteries.   - Veggie cheese is OK which can be found in the fresh  produce section at [employer] or [employer] or [employer] ==========================================================  - and Triglycerides (    232    ) or fats in blood are too high  (goal is less than 150)    - Recommend avoid fried & greasy foods,  sweets / candy,   - Avoid white rice  (brown or wild rice or Quinoa is OK),   - Avoid white potatoes  (sweet potatoes are OK)   - Avoid anything made from  white flour  - bagels, doughnuts, rolls, buns, biscuits, white and   wheat breads, pizza crust and traditional  pasta made of white flour & egg white  - (vegetarian pasta or spinach or wheat pasta is OK).    - Multi-grain bread is OK - like multi-grain flat bread or  sandwich thins.   - Avoid alcohol in excess.   - Exercise is also important. ========================================================== ==========================================================  -  A1c= 5.9% - elevated average blood sugars  (Ideal or goal is less than 5.7% , So . . . . Marland Kitchen   - Avoid Sweets, Candy & White Stuff   - Rice, Potatoes, Breads &  Pasta ========================================================== ==========================================================  -  Vitamin D is 70 - Excellent   ========================================================== ==========================================================  -  All Else - CBC - Kidneys - Electrolytes - Liver - Magnesium & Thyroid    - all  Normal / OK ===========================================================  ===========================================================

## 2020-04-26 LAB — TB SKIN TEST
Induration: 0 mm
TB Skin Test: NEGATIVE

## 2020-05-01 ENCOUNTER — Other Ambulatory Visit: Payer: Self-pay | Admitting: Internal Medicine

## 2020-05-01 DIAGNOSIS — I10 Essential (primary) hypertension: Secondary | ICD-10-CM

## 2020-07-12 ENCOUNTER — Other Ambulatory Visit: Payer: Self-pay | Admitting: Adult Health

## 2020-07-12 DIAGNOSIS — I10 Essential (primary) hypertension: Secondary | ICD-10-CM

## 2020-08-02 DIAGNOSIS — N529 Male erectile dysfunction, unspecified: Secondary | ICD-10-CM | POA: Insufficient documentation

## 2020-08-02 NOTE — Progress Notes (Signed)
MEDICARE ANNUAL WELLNESS VISIT AND FOLLOW UP Assessment:   Wesley Oneal was seen today for follow-up and medicare wellness.  Diagnoses and all orders for this visit:  Welcome to Medicare preventive visit Due annually   Primary hypertension Continue medication Monitor blood pressure at home; call if consistently over 130/80 Continue DASH diet.   Reminder to go to the ER if any CP, SOB, nausea, dizziness, severe HA, changes vision/speech, left arm numbness and tingling and jaw pain. -     CBC with Differential/Platelet -     COMPLETE METABOLIC PANEL WITH GFR -     Magnesium  Seasonal allergic rhinitis, unspecified trigger Continue allegra PRN  Gastroesophageal reflux disease, unspecified whether esophagitis present Well managed on current medications Discussed diet, avoiding triggers and other lifestyle changes  Abnormal glucose (prediabetes) Discussed disease and risks Discussed diet/exercise, weight management  A1C q854m -     COMPLETE METABOLIC PANEL WITH GFR  Former smoker (quit 1996, 56 pack year history) -     DG Chest 2 View; Future  Hyperlipidemia, mixed Mild elevations currently treated by lifestyle LDL goal <100 Discussed low fat diet, increase fiber, weight loss encouraged Check lipid panel.  -     Lipid panel -     TSH  Medication management -     CBC with Differential/Platelet -     COMPLETE METABOLIC PANEL WITH GFR -     Magnesium  Obesity (BMI 30.0-34.9) Long discussion about weight loss, diet, and exercise Recommended diet heavy in fruits and veggies and low in animal meats, cheeses, and dairy products, appropriate calorie intake Patient will work on cutting out soda, walking Discussed appropriate weight for height and initial goal (<220lb) Follow up at next visit  Vitamin D deficiency At goal at recent check; continue to recommend supplementation for goal of 60-100 Defer vitamin D level  Erectile dysfunction, unspecified erectile dysfunction type -      tadalafil (CIALIS) 20 MG tablet; Take 1/2 to 1 tablet every 2 to 3 days if needed for XXXX  Screening for colon cancer Colonoscopy- patient declines a colonoscopy even though the risks and benefits were discussed at length. Colon cancer is 3rd most diagnosed cancer and 2nd leading cause of death in both men and women 65 years of age and older. Patient understands the risk of cancer and death with declining the test however they are willing to do cologuard screening instead. They understand that this is not as sensitive or specific as a colonoscopy and they are still recommended to get a colonoscopy. The cologuard will be sent out to their house.  -     Cologuard  Need for hepatitis C screening test -     Hepatitis C antibody  Over 30 minutes of exam, counseling, chart review, and critical decision making was performed  Future Appointments  Date Time Provider Department Center  05/02/2021 11:00 AM Lucky CowboyMcKeown, William, MD [facility] None  08/05/2021  9:30 AM Judd Gaudierorbett, Ivis Nicolson, NP [facility] None     Plan:   During the course of the visit the patient was educated and counseled about appropriate screening and preventive services including:    Pneumococcal vaccine   Influenza vaccine  Prevnar 13  Td vaccine  Screening electrocardiogram  Colorectal cancer screening  Diabetes screening  Glaucoma screening  Nutrition counseling    Subjective:  Wesley Oneal is a 65 y.o. male who presents for Medicare Annual Wellness Visit and 3 month follow up for HTN, hyperlipidemia, prediabetes, and vitamin D Def.  Former smoker, 54 pack year, quit in 1996.  Last CXR 07/2019    IMPRESSION: Patchy bilateral heterogeneous opacities in a mid lower lung zone predominant distribution, consistent with multifocal pneumonia, the pattern is typical of COVID-19.  GERD well controlled by omeprazole.   BMI is Body mass index is 35.06 kg/m., he has been working on diet and exercise, admits drinking  soda, cheerwine, 3-4 small cans per week, willing to quit.  Wt Readings from Last 3 Encounters:  08/03/20 234 lb (106.1 kg)  04/18/20 235 lb (106.6 kg)  08/01/19 228 lb (103.4 kg)   His blood pressure has been controlled at home, today their BP is BP: 102/72 He does workout. He denies chest pain, shortness of breath, dizziness.    He is not on cholesterol medication and denies myalgias. His cholesterol is not at goal. The cholesterol last visit was:   Lab Results  Component Value Date   CHOL 185 04/18/2020   HDL 37 (L) 04/18/2020   LDLCALC 113 (H) 04/18/2020   TRIG 232 (H) 04/18/2020   CHOLHDL 5.0 (H) 04/18/2020   He has been working on diet and exercise for prediabetes, and denies increased appetite, nausea, paresthesia of the feet, polydipsia, polyuria and visual disturbances. Last A1C in the office was:  Lab Results  Component Value Date   HGBA1C 5.9 (H) 04/18/2020   Last GFR Lab Results  Component Value Date   GFRNONAA 72 04/18/2020    Patient is on Vitamin D supplement.   Lab Results  Component Value Date   VD25OH 70 04/18/2020      Medication Review:   Current Outpatient Medications (Cardiovascular):  .  atenolol (TENORMIN) 100 MG tablet, Take 1 tablet Daily for BP .  hydrochlorothiazide (HYDRODIURIL) 25 MG tablet, Take 1 tablet Daily for BP & Fluid Retention / Ankle Swelling .  losartan (COZAAR) 50 MG tablet, TAKE 1 TABLET BY MOUTH DAILY FOR BLOOD PRESSURE .  minoxidil (LONITEN) 10 MG tablet, TAKE 1/2 TO 1 TABLET BY MOUTH DAILY FOR BLOOD PRESSURE .  tadalafil (CIALIS) 20 MG tablet, Take 1/2 to 1 tablet every 2 to 3 days if needed for XXXX  Current Outpatient Medications (Respiratory):  .  fexofenadine (ALLEGRA) 180 MG tablet, Take 1 tablet (180 mg total) by mouth daily.  Current Outpatient Medications (Analgesics):  .  aspirin EC 81 MG tablet, Take   1 tablet    Daily   Current Outpatient Medications (Other):  Marland Kitchen  Cholecalciferol (VITAMIN D PO), Take 5,000  Units by mouth daily. .  Magnesium 500 MG TABS, Take 1 tablet by mouth daily. .  Omeprazole Magnesium (PRILOSEC OTC PO), Take 20 mg by mouth daily. .  Multiple Vitamins-Minerals (HAIR SKIN AND NAILS FORMULA PO), Take by mouth daily. (Patient not taking: Reported on 08/03/2020)  Allergies: Allergies  Allergen Reactions  . Lisinopril Cough  . Penicillins     Current Problems (verified) has HTN (hypertension); Hyperlipidemia, mixed; Obesity (BMI 30.0-34.9); Vitamin D deficiency; Medication management; GERD (gastroesophageal reflux disease); Abnormal glucose (Prediabetes); FHx: colon cancer; Former smoker (quit 2003, 66 pack year history); Seasonal allergic rhinitis; and ED (erectile dysfunction) on their problem list.  Screening Tests Immunization History  Administered Date(s) Administered  . Moderna Sars-Covid-2 Vaccination 09/06/2019, 10/06/2019  . PPD Test 05/25/2015, 06/25/2016, 12/02/2018, 04/18/2020  . Td 09/06/2014    Preventative care: Last colonoscopy: never, does have family history of colon cancer, however firmly declines colonoscopy but willing to do cologuard after discussion of risks and benefits of  early detection  Prior vaccinations: TD or Tdap: 2016  Influenza: declines   Pneumococcal: declines for now Prevnar13: -  Shingles/Zostavax: declines Covid 19: 2/2, moderna  Names of Other Physician/Practitioners you currently use: 1. [facility] here for primary care 2. Dr. ?, eye doctor, last visit 2021 3. , dentist, last visit remote, full dentures  Patient Care Team: Lucky Cowboy, MD as PCP - General (Internal Medicine)  Surgical: He  has no past surgical history on file. Family His family history includes Cancer in his mother; Heart disease in his father; Hypertension in his father and mother. Social history  He reports that he quit smoking about 26 years ago. His smoking use included cigarettes. He started smoking  about 52 years ago. He has a 54.00 pack-year smoking history. He has never used smokeless tobacco. He reports current alcohol use of about 24.0 standard drinks of alcohol per week. He reports previous drug use.  MEDICARE WELLNESS OBJECTIVES: Physical activity: Current Exercise Habits: Home exercise routine, Type of exercise: Other - see comments (fishing), Time (Minutes): 30, Frequency (Times/Week): 2, Weekly Exercise (Minutes/Week): 60, Intensity: Mild, Exercise limited by: None identified Cardiac risk factors: Cardiac Risk Factors include: male gender;hypertension;dyslipidemia;advanced age (>44men, >30 women);smoking/ tobacco exposure;obesity (BMI >30kg/m2) Depression/mood screen:   Depression screen [facility] 2/9 08/03/2020  Decreased Interest 0  Down, Depressed, Hopeless 0  PHQ - 2 Score 0    ADLs:  In your present state of health, do you have any difficulty performing the following activities: 08/03/2020 04/17/2020  Hearing? N N  Vision? N N  Difficulty concentrating or making decisions? N N  Walking or climbing stairs? N N  Dressing or bathing? N N  Doing errands, shopping? N N  Some recent data might be hidden     Cognitive Testing  Alert? Yes  Normal Appearance?Yes  Oriented to person? Yes  Place? Yes   Time? Yes  Recall of three objects?  Yes  Can perform simple calculations? Yes  Displays appropriate judgment?Yes  Can read the correct time from a watch face?Yes  EOL planning: Does Patient Have a Medical Advance Directive?: No Would patient like information on creating a medical advance directive?: Yes (MAU/Ambulatory/Procedural Areas - Information given)   Objective:   Today's Vitals   08/03/20 0958  BP: 102/72  Pulse: 74  Temp: 97.9 F (36.6 C)  SpO2: 96%  Weight: 234 lb (106.1 kg)  Height: 5' 8.5" (1.74 m)   Body mass index is 35.06 kg/m.  General appearance: alert, no distress, WD/WN, male HEENT: normocephalic, sclerae anicteric, TMs pearly, nares patent, no  discharge or erythema, pharynx normal Oral cavity: MMM, no lesions Neck: supple, no lymphadenopathy, no thyromegaly, no masses Heart: RRR, normal S1, S2, no murmurs Lungs: CTA bilaterally, no wheezes, rhonchi, or rales Abdomen: +bs, soft, non tender, non distended, no masses, no hepatomegaly, no splenomegaly Musculoskeletal: nontender, no swelling, no obvious deformity Extremities: no edema, no cyanosis, no clubbing Pulses: 2+ symmetric, upper and lower extremities, normal cap refill Neurological: alert, oriented x 3, CN2-12 intact, strength normal upper extremities and lower extremities, sensation normal throughout, DTRs 2+ throughout, no cerebellar signs, gait normal Psychiatric: normal affect, behavior normal, pleasant   EKG: had 04/18/2020, defer AAA [place]: neg 04/18/2020  Medicare Attestation I have personally reviewed: The patient's medical and social history Their use of alcohol, tobacco or illicit drugs Their current medications and supplements The patient's functional ability including ADLs,fall risks, home safety risks, cognitive, and hearing and visual  impairment Diet and physical activities Evidence for depression or mood disorders  The patient's weight, height, BMI, and visual acuity have been recorded in the chart.  I have made referrals, counseling, and provided education to the patient based on review of the above and I have provided the patient with a written personalized care plan for preventive services.     Dan Maker, NP   08/03/2020

## 2020-08-03 ENCOUNTER — Other Ambulatory Visit: Payer: Self-pay

## 2020-08-03 ENCOUNTER — Encounter: Payer: Self-pay | Admitting: Adult Health

## 2020-08-03 ENCOUNTER — Ambulatory Visit
Admission: RE | Admit: 2020-08-03 | Discharge: 2020-08-03 | Disposition: A | Discharge status: Home / Self Care | Payer: BC Managed Care – PPO | Source: Ambulatory Visit | Attending: Adult Health | Admitting: Adult Health

## 2020-08-03 ENCOUNTER — Ambulatory Visit (INDEPENDENT_AMBULATORY_CARE_PROVIDER_SITE_OTHER): Payer: BC Managed Care – PPO | Admitting: Adult Health

## 2020-08-03 VITALS — BP 102/72 | HR 74 | Temp 97.9°F | Ht 68.5 in | Wt 234.0 lb

## 2020-08-03 DIAGNOSIS — E669 Obesity, unspecified: Secondary | ICD-10-CM

## 2020-08-03 DIAGNOSIS — N529 Male erectile dysfunction, unspecified: Secondary | ICD-10-CM

## 2020-08-03 DIAGNOSIS — E782 Mixed hyperlipidemia: Secondary | ICD-10-CM

## 2020-08-03 DIAGNOSIS — R7309 Other abnormal glucose: Secondary | ICD-10-CM | POA: Diagnosis not present

## 2020-08-03 DIAGNOSIS — I10 Essential (primary) hypertension: Secondary | ICD-10-CM

## 2020-08-03 DIAGNOSIS — J302 Other seasonal allergic rhinitis: Secondary | ICD-10-CM | POA: Diagnosis not present

## 2020-08-03 DIAGNOSIS — Z87891 Personal history of nicotine dependence: Secondary | ICD-10-CM

## 2020-08-03 DIAGNOSIS — Z0000 Encounter for general adult medical examination without abnormal findings: Secondary | ICD-10-CM

## 2020-08-03 DIAGNOSIS — Z79899 Other long term (current) drug therapy: Secondary | ICD-10-CM

## 2020-08-03 DIAGNOSIS — R6889 Other general symptoms and signs: Secondary | ICD-10-CM

## 2020-08-03 DIAGNOSIS — Z1159 Encounter for screening for other viral diseases: Secondary | ICD-10-CM | POA: Diagnosis not present

## 2020-08-03 DIAGNOSIS — K219 Gastro-esophageal reflux disease without esophagitis: Secondary | ICD-10-CM | POA: Diagnosis not present

## 2020-08-03 DIAGNOSIS — Z0001 Encounter for general adult medical examination with abnormal findings: Secondary | ICD-10-CM | POA: Diagnosis not present

## 2020-08-03 DIAGNOSIS — J189 Pneumonia, unspecified organism: Secondary | ICD-10-CM | POA: Diagnosis not present

## 2020-08-03 DIAGNOSIS — I700 Atherosclerosis of aorta: Secondary | ICD-10-CM | POA: Diagnosis not present

## 2020-08-03 DIAGNOSIS — Z1211 Encounter for screening for malignant neoplasm of colon: Secondary | ICD-10-CM

## 2020-08-03 DIAGNOSIS — E559 Vitamin D deficiency, unspecified: Secondary | ICD-10-CM

## 2020-08-03 MED ORDER — TADALAFIL 20 MG PO TABS: ORAL_TABLET | ORAL | 1 refills | Status: DC

## 2020-08-03 NOTE — Patient Instructions (Signed)
  Wesley Oneal , Thank you for taking time to come for your Medicare Wellness Visit. I appreciate your ongoing commitment to your health goals. Please review the following plan we discussed and let me know if I can assist you in the future.   These are the goals we discussed: Goals    . Blood Pressure < 130/80    . DIET - DECREASE SODA OR JUICE INTAKE    . Exercise 3x per week (30 min per time)    . LDL CALC < 100    . Weight (lb) < 220 lb (99.8 kg)       This is a list of the screening recommended for you and due dates:  Health Maintenance  Topic Date Due  .  Hepatitis C: One time screening is recommended by Center for Disease Control  (CDC) for  adults born from [birth year] through 1965.   Never done  . COVID-19 Vaccine (3 - Booster for Moderna series) 08/19/2020*  . Colon Cancer Screening  09/02/2020*  . Pneumonia vaccines (1 of 2 - PCV13) 08/03/2021*  . Flu Shot  11/05/2020  . Tetanus Vaccine  09/05/2024  . HPV Vaccine  Aged Out  . HIV Screening  Discontinued  *Topic was postponed. The date shown is not the original due date.     COLOGUARD INFORMATION   Cologuard is an easy to use noninvasive colon cancer screening test based on the latest advances in stool DNA science.   Colon cancer is 3rd most diagnosed cancer and 2nd leading cause of death in both men and women 65 years of age and older despite being one of the most preventable and treatable cancers if found early.  4 of out 5 people diagnosed with colon cancer have NO prior family history.  When caught EARLY 90% of colon cancer is curable.   More than 92% of cologuard patients have NO out of pocket cost for screening however only your insurer can confirm how Cologuard would be covered for you. [facility]ologuard has a team of specialist that can help you contact your insurer and ask the right questions. Please call [phone] so they can help.   You will receive a short call from [place] support center at [employer], when you receive a call they will say they are from [place],  to confirm your mailing address and give you more information.  When they calll you, it will appear on the caller ID as "[employer]" or in some cases only this number will appear, [phone].   [employer] will ship your collection kit directly to you. You will collect a single stool sample in the privacy of your own home, no special preparation required. You will return the kit via [place] pre-paid shipping or pick-up, in the same box it arrived in. Then I will contact you to discuss your results after I receive them from the laboratory.   If you have any questions or concerns, [facility] Customer Support Specialist are available 24 hours a day, 7 days a week at [phone] or go to [email or web address].

## 2020-08-06 ENCOUNTER — Encounter: Payer: Self-pay | Admitting: Adult Health

## 2020-08-06 DIAGNOSIS — I700 Atherosclerosis of aorta: Secondary | ICD-10-CM | POA: Insufficient documentation

## 2020-08-06 LAB — CBC WITH DIFFERENTIAL/PLATELET
Absolute Monocytes: 496 cells/uL (ref 200–950)
Basophils Absolute: 29 cells/uL (ref 0–200)
Basophils Relative: 0.7 %
Eosinophils Absolute: 70 cells/uL (ref 15–500)
Eosinophils Relative: 1.7 %
HCT: 45.8 % (ref 38.5–50.0)
Hemoglobin: 15.1 g/dL (ref 13.2–17.1)
Lymphs Abs: 1214 cells/uL (ref 850–3900)
MCH: 29.5 pg (ref 27.0–33.0)
MCHC: 33 g/dL (ref 32.0–36.0)
MCV: 89.5 fL (ref 80.0–100.0)
MPV: 11 fL (ref 7.5–12.5)
Monocytes Relative: 12.1 %
Neutro Abs: 2292 cells/uL (ref 1500–7800)
Neutrophils Relative %: 55.9 %
Platelets: 214 10*3/uL (ref 140–400)
RBC: 5.12 10*6/uL (ref 4.20–5.80)
RDW: 13.7 % (ref 11.0–15.0)
Total Lymphocyte: 29.6 %
WBC: 4.1 10*3/uL (ref 3.8–10.8)

## 2020-08-06 LAB — COMPLETE METABOLIC PANEL WITH GFR
AG Ratio: 1.6 (calc) (ref 1.0–2.5)
ALT: 16 U/L (ref 9–46)
AST: 15 U/L (ref 10–35)
Albumin: 4.5 g/dL (ref 3.6–5.1)
Alkaline phosphatase (APISO): 44 U/L (ref 35–144)
BUN: 17 mg/dL (ref 7–25)
CO2: 29 mmol/L (ref 20–32)
Calcium: 10.1 mg/dL (ref 8.6–10.3)
Chloride: 100 mmol/L (ref 98–110)
Creat: 1.12 mg/dL (ref 0.70–1.25)
GFR, Est African American: 79 mL/min/{1.73_m2} (ref >=60)
GFR, Est Non African American: 69 mL/min/{1.73_m2} (ref >=60)
Globulin: 2.9 g/dL (calc) (ref 1.9–3.7)
Glucose, Bld: 112 mg/dL — ABNORMAL HIGH (ref 65–99)
Potassium: 4.1 mmol/L (ref 3.5–5.3)
Sodium: 138 mmol/L (ref 135–146)
Total Bilirubin: 0.6 mg/dL (ref 0.2–1.2)
Total Protein: 7.4 g/dL (ref 6.1–8.1)

## 2020-08-06 LAB — TSH: TSH: 1.06 mIU/L (ref 0.40–4.50)

## 2020-08-06 LAB — LIPID PANEL
Cholesterol: 185 mg/dL (ref <200)
HDL: 33 mg/dL — ABNORMAL LOW (ref >=40)
Non-HDL Cholesterol (Calc): 152 mg/dL (calc) — ABNORMAL HIGH (ref <130)
Total CHOL/HDL Ratio: 5.6 (calc) — ABNORMAL HIGH (ref <5.0)
Triglycerides: 417 mg/dL — ABNORMAL HIGH (ref <150)

## 2020-08-06 LAB — HEPATITIS C ANTIBODY
Hepatitis C Ab: NONREACTIVE
SIGNAL TO CUT-OFF: 0.01 (ref <1.00)

## 2020-08-06 LAB — MAGNESIUM: Magnesium: 2 mg/dL (ref 1.5–2.5)

## 2020-08-15 DIAGNOSIS — Z1211 Encounter for screening for malignant neoplasm of colon: Secondary | ICD-10-CM | POA: Diagnosis not present

## 2020-08-16 LAB — COLOGUARD: Cologuard: POSITIVE — AB

## 2020-08-23 ENCOUNTER — Encounter: Payer: Self-pay | Admitting: Adult Health

## 2020-08-23 ENCOUNTER — Telehealth: Payer: Self-pay

## 2020-08-23 NOTE — Telephone Encounter (Signed)
Cologuard results: Positive

## 2020-08-27 ENCOUNTER — Other Ambulatory Visit: Payer: Self-pay | Admitting: Adult Health

## 2020-08-27 DIAGNOSIS — Z800 Family history of malignant neoplasm of digestive organs: Secondary | ICD-10-CM

## 2020-08-27 DIAGNOSIS — R195 Other fecal abnormalities: Secondary | ICD-10-CM

## 2020-10-01 DIAGNOSIS — R195 Other fecal abnormalities: Secondary | ICD-10-CM | POA: Diagnosis not present

## 2020-10-10 ENCOUNTER — Other Ambulatory Visit: Payer: Self-pay | Admitting: Internal Medicine

## 2020-10-10 DIAGNOSIS — I10 Essential (primary) hypertension: Secondary | ICD-10-CM

## 2020-12-25 DIAGNOSIS — K644 Residual hemorrhoidal skin tags: Secondary | ICD-10-CM | POA: Diagnosis not present

## 2020-12-25 DIAGNOSIS — D125 Benign neoplasm of sigmoid colon: Secondary | ICD-10-CM | POA: Diagnosis not present

## 2020-12-25 DIAGNOSIS — D122 Benign neoplasm of ascending colon: Secondary | ICD-10-CM | POA: Diagnosis not present

## 2020-12-25 DIAGNOSIS — K5730 Diverticulosis of large intestine without perforation or abscess without bleeding: Secondary | ICD-10-CM | POA: Diagnosis not present

## 2020-12-25 DIAGNOSIS — Z8371 Family history of colonic polyps: Secondary | ICD-10-CM | POA: Diagnosis not present

## 2020-12-25 DIAGNOSIS — R195 Other fecal abnormalities: Secondary | ICD-10-CM | POA: Diagnosis not present

## 2020-12-25 DIAGNOSIS — K648 Other hemorrhoids: Secondary | ICD-10-CM | POA: Diagnosis not present

## 2020-12-25 DIAGNOSIS — Z1211 Encounter for screening for malignant neoplasm of colon: Secondary | ICD-10-CM | POA: Diagnosis not present

## 2020-12-25 DIAGNOSIS — D123 Benign neoplasm of transverse colon: Secondary | ICD-10-CM | POA: Diagnosis not present

## 2020-12-25 LAB — HM COLONOSCOPY

## 2021-01-08 ENCOUNTER — Other Ambulatory Visit: Payer: Self-pay | Admitting: Adult Health

## 2021-01-08 DIAGNOSIS — I10 Essential (primary) hypertension: Secondary | ICD-10-CM

## 2021-01-16 ENCOUNTER — Encounter: Payer: Self-pay | Admitting: Internal Medicine

## 2021-02-14 ENCOUNTER — Other Ambulatory Visit: Payer: Self-pay | Admitting: Adult Health

## 2021-02-14 DIAGNOSIS — I10 Essential (primary) hypertension: Secondary | ICD-10-CM

## 2021-05-01 ENCOUNTER — Encounter: Payer: Self-pay | Admitting: Internal Medicine

## 2021-05-01 NOTE — Progress Notes (Addendum)
Annual  Screening/Preventative Visit  & Comprehensive Evaluation & Examination  Future Appointments  Date Time Provider Department  05/02/2021 11:00 AM Unk Pinto, MD [facility]  08/05/2021         Wellness  9:30 AM Magda Bernheim, NP [facility]  05/07/2022 11:00 AM Unk Pinto, MD [facility]            This very nice 66 y.o. [name]  presents for a Screening /Preventative Visit & comprehensive evaluation and management of multiple medical co-morbidities.  Patient has been followed for HTN, HLD,  Prediabetes and Vitamin D Deficiency. CXR in May 2022 showed Aortic Atherosclerosis.       HTN predates circa  2017 . Patient's BP has been controlled at home.  Today's BP is at goal -  134/80. Patient denies any cardiac symptoms as chest pain, palpitations, shortness of breath, dizziness or ankle swelling.      Patient's hyperlipidemia is controlled with diet and medications. Patient denies myalgias or other medication SE's. Last lipids were at goal except elevated Trig's :  Lab Results  Component Value Date   CHOL 185 08/03/2020   HDL 33 (L) 08/03/2020   LDLCALC not calculated 08/03/2020   TRIG 417 (H) 08/03/2020   CHOLHDL 5.6 (H) 08/03/2020        Patient has hx/o Moderate Obesity (BMI 33+) and hx/o prediabetes (A1c 6.1% /2017)  and patient denies reactive hypoglycemic symptoms, visual blurring, diabetic polys or paresthesias. Last A1c was near goal :   Lab Results  Component Value Date   HGBA1C 5.9 (H) 04/18/2020         Finally, patient has history of Vitamin D Deficiency  and last vitamin D was at goal  :   Lab Results  Component Value Date   VD25OH 57 04/18/2020     Current Outpatient Medications on File Prior to Visit  Medication Sig   aspirin EC 81 MG tablet Take   1 tablet  Daily   atenolol  100 MG tablet TAKE 1 TABLET  DAILY    VITAMIN D  5,000 Units  Take daily.   fexofenadine 180 MG tablet Take 1 tablet  daily.   hydrochlorothiazide  25 MG tablet TAKE 1  TABLET  DAILY    losartan  50 MG tablet TAKE 1 TABLET DAILY    Magnesium 500 MG TABS Take 1 tablet daily.   minoxidil 10 MG tablet TAKE 1/2 TO 1 TABLET  DAILY    Omeprazole 20 mg OTC Take  daily.   tadalafil 20 MG tablet Take 1/2 to 1 tablet every 2 to 3 days if needed      Past Medical History:  Diagnosis Date   Hyperlipidemia    Hypertension Feb 2017   Prediabetes Feb 2017    Vitamin D deficiency disease      Health Maintenance  Topic Date Due   Pneumonia Vaccine 6+ Years old (1 - PCV) Never done   Zoster Vaccines- Shingrix (1 of 2) Never done   COVID-19 Vaccine (3 - Moderna risk series) 11/03/2019   INFLUENZA VACCINE  Never done   TETANUS/TDAP  09/05/2024   COLONOSCOPY  12/26/2030   Hepatitis C Screening  Completed   HIV Screening  Discontinued     Immunization History  Administered Date(s) Administered   Moderna Sars-Covid-2 Vacc 09/06/2019, 10/06/2019   PPD Test  12/02/2018, 04/18/2020   Td 09/06/2014    Last Colon - 12/25/2020 - Dr Alessandra Bevels- > Mult polyps                        -  Recc repeat Colon in 5 years - due Sept 2024  No past surgical history on file.   Family History  Problem Relation Age of Onset   Cancer Mother    Hypertension Mother    Hypertension Father    Heart disease Father      Social History   Tobacco Use   Smoking status: Former    Packs/day: 1.50    Years: 36.00    Pack years: 54.00    Types: Cigarettes    Start date: 83    Quit date: 1996    Years since quitting: 27.0   Smokeless tobacco: Never  Substance Use Topics   Alcohol use: Yes    Alcohol/week: 24.0 standard drinks    Types: 24 Standard drinks or equivalent per week    Comment: 1 case of beer and 2 shots of vodka   Drug use: Not Currently      ROS Constitutional: Denies fever, chills, weight loss/gain, headaches, insomnia,  night sweats or change in appetite. Does c/o fatigue. Eyes: Denies redness, blurred vision, diplopia, discharge, itchy or watery  eyes.  ENT: Denies discharge, congestion, post nasal drip, epistaxis, sore throat, earache, hearing loss, dental pain, Tinnitus, Vertigo, Sinus pain or snoring.  Cardio: Denies chest pain, palpitations, irregular heartbeat, syncope, dyspnea, diaphoresis, orthopnea, PND, claudication or edema Respiratory: denies cough, dyspnea, DOE, pleurisy, hoarseness, laryngitis or wheezing.  Gastrointestinal: Denies dysphagia, heartburn, reflux, water brash, pain, cramps, nausea, vomiting, bloating, diarrhea, constipation, hematemesis, melena, hematochezia, jaundice or hemorrhoids Genitourinary: Denies dysuria, frequency, urgency, nocturia, hesitancy, discharge, hematuria or flank pain Musculoskeletal: Denies arthralgia, myalgia, stiffness, Jt. Swelling, pain, limp or strain/sprain. Denies Falls. Skin: Denies puritis, rash, hives, warts, acne, eczema or change in skin lesion Neuro: No weakness, tremor, incoordination, spasms, paresthesia or pain Psychiatric: Denies confusion, memory loss or sensory loss. Denies Depression. Endocrine: Denies change in weight, skin, hair change, nocturia, and paresthesia, diabetic polys, visual blurring or hyper / hypo glycemic episodes.  Heme/Lymph: No excessive bleeding, bruising or enlarged lymph nodes.   Physical Exam  BP 134/80   Pulse 78   Temp 97.9 F (36.6 C)   Resp 16   Ht 5' 8.5" (1.74 m)   Wt 236 lb 12.8 oz (107.4 kg)   SpO2 95%   BMI 35.48 kg/m   General Appearance: Well nourished and well groomed and in no apparent distress.  Eyes: PERRLA, EOMs, conjunctiva no swelling or erythema, normal fundi and vessels. Sinuses: No frontal/maxillary tenderness ENT/Mouth: EACs patent / TMs  nl. Nares clear without erythema, swelling, mucoid exudates. Oral hygiene is good. No erythema, swelling, or exudate. Tongue normal, non-obstructing. Tonsils not swollen or erythematous. Hearing normal.  Neck: Supple, thyroid not palpable. No bruits, nodes or JVD. Respiratory:  Respiratory effort normal.  BS equal and clear bilateral without rales, rhonci, wheezing or stridor. Cardio: Heart sounds are normal with regular rate and rhythm and no murmurs, rubs or gallops. Peripheral pulses are normal and equal bilaterally without edema. No aortic or femoral bruits. Chest: symmetric with normal excursions and percussion.  Abdomen: Soft, with Nl bowel sounds. Nontender, no guarding, rebound, hernias, masses, or organomegaly.  Lymphatics: Non tender without lymphadenopathy.  Musculoskeletal: Full ROM all peripheral extremities, joint stability, 5/5 strength, and normal gait. Skin: Warm and dry without rashes, lesions, cyanosis, clubbing or  ecchymosis.  Neuro: Cranial nerves intact, reflexes equal bilaterally. Normal muscle tone, no cerebellar symptoms. Sensation intact.  Pysch: Alert and oriented X 3 with normal affect, insight and judgment appropriate.  Assessment and Plan  1. Annual Preventative/Screening Exam    2. Essential hypertension  - EKG 12-Lead - [place], RETROPERITNL ABD,  LTD - Microalbumin / creatinine urine ratio - POC Hemoccult Bld/Stl  - CBC with Differential/Platelet - COMPLETE METABOLIC PANEL WITH GFR - Magnesium - TSH  3. Hyperlipidemia, mixed  - EKG 12-Lead - [place], RETROPERITNL ABD,  LTD - Lipid panel - TSH  4. Abnormal glucose  - EKG 12-Lead - [place], RETROPERITNL ABD,  LTD - Hemoglobin A1c - Insulin, random  5. Vitamin D deficiency  - VITAMIN D 25 Hydroxy   6. Aortic atherosclerosis (HCC)  - EKG 12-Lead - [place], RETROPERITNL ABD,  LTD - Lipid panel  7. Obesity (BMI 30.0-34.9)  - TSH  8. BPH with obstruction/lower urinary tract symptoms  - Urinalysis, Routine w reflex microscopic - PSA  9. Prostate cancer screening  - PSA  10. Screening for ischemic heart disease  - EKG 12-Lead  11. FHx: heart disease  - EKG 12-Lead - [place], RETROPERITNL ABD,  LTD  12. Screening for AAA (aortic abdominal aneurysm)  - [place],  RETROPERITNL ABD,  LTD  13. Medication management  - Urinalysis, Routine w reflex microscopic - Microalbumin / creatinine urine ratio - CBC with Differential/Platelet - COMPLETE METABOLIC PANEL WITH GFR - Magnesium - Lipid panel - TSH - Hemoglobin A1c - Insulin, random - VITAMIN D 25 Hydroxy           Patient was counseled in prudent diet, weight control to achieve/maintain BMI less than 25, BP monitoring, regular exercise and medications as discussed.  Discussed med effects and SE's. Routine screening labs and tests as requested with regular follow-up as recommended. Over 40 minutes of exam, counseling, chart review and high complex critical decision making was performed   Kirtland Bouchard, MD

## 2021-05-01 NOTE — Patient Instructions (Signed)

## 2021-05-02 ENCOUNTER — Encounter: Payer: Self-pay | Admitting: Internal Medicine

## 2021-05-02 ENCOUNTER — Other Ambulatory Visit: Payer: Self-pay

## 2021-05-02 ENCOUNTER — Ambulatory Visit (INDEPENDENT_AMBULATORY_CARE_PROVIDER_SITE_OTHER): Payer: BC Managed Care – PPO | Admitting: Internal Medicine

## 2021-05-02 VITALS — BP 134/80 | HR 78 | Temp 97.9°F | Resp 16 | Ht 68.5 in | Wt 236.8 lb

## 2021-05-02 DIAGNOSIS — R350 Frequency of micturition: Secondary | ICD-10-CM | POA: Diagnosis not present

## 2021-05-02 DIAGNOSIS — I10 Essential (primary) hypertension: Secondary | ICD-10-CM

## 2021-05-02 DIAGNOSIS — Z125 Encounter for screening for malignant neoplasm of prostate: Secondary | ICD-10-CM | POA: Diagnosis not present

## 2021-05-02 DIAGNOSIS — Z13220 Encounter for screening for lipoid disorders: Secondary | ICD-10-CM | POA: Diagnosis not present

## 2021-05-02 DIAGNOSIS — N401 Enlarged prostate with lower urinary tract symptoms: Secondary | ICD-10-CM | POA: Diagnosis not present

## 2021-05-02 DIAGNOSIS — I700 Atherosclerosis of aorta: Secondary | ICD-10-CM | POA: Diagnosis not present

## 2021-05-02 DIAGNOSIS — Z8249 Family history of ischemic heart disease and other diseases of the circulatory system: Secondary | ICD-10-CM | POA: Diagnosis not present

## 2021-05-02 DIAGNOSIS — Z136 Encounter for screening for cardiovascular disorders: Secondary | ICD-10-CM

## 2021-05-02 DIAGNOSIS — Z131 Encounter for screening for diabetes mellitus: Secondary | ICD-10-CM | POA: Diagnosis not present

## 2021-05-02 DIAGNOSIS — Z79899 Other long term (current) drug therapy: Secondary | ICD-10-CM

## 2021-05-02 DIAGNOSIS — E782 Mixed hyperlipidemia: Secondary | ICD-10-CM

## 2021-05-02 DIAGNOSIS — Z87891 Personal history of nicotine dependence: Secondary | ICD-10-CM | POA: Diagnosis not present

## 2021-05-02 DIAGNOSIS — N138 Other obstructive and reflux uropathy: Secondary | ICD-10-CM

## 2021-05-02 DIAGNOSIS — E66811 Obesity, class 1: Secondary | ICD-10-CM

## 2021-05-02 DIAGNOSIS — E669 Obesity, unspecified: Secondary | ICD-10-CM

## 2021-05-02 DIAGNOSIS — Z1389 Encounter for screening for other disorder: Secondary | ICD-10-CM | POA: Diagnosis not present

## 2021-05-02 DIAGNOSIS — Z0000 Encounter for general adult medical examination without abnormal findings: Secondary | ICD-10-CM | POA: Diagnosis not present

## 2021-05-02 DIAGNOSIS — R7309 Other abnormal glucose: Secondary | ICD-10-CM

## 2021-05-02 DIAGNOSIS — E559 Vitamin D deficiency, unspecified: Secondary | ICD-10-CM

## 2021-05-02 DIAGNOSIS — Z0001 Encounter for general adult medical examination with abnormal findings: Secondary | ICD-10-CM

## 2021-05-03 LAB — CBC WITH DIFFERENTIAL/PLATELET
Absolute Monocytes: 575 cells/uL (ref 200–950)
Basophils Absolute: 41 cells/uL (ref 0–200)
Basophils Relative: 0.9 %
Eosinophils Absolute: 83 cells/uL (ref 15–500)
Eosinophils Relative: 1.8 %
HCT: 44.2 % (ref 38.5–50.0)
Hemoglobin: 14.7 g/dL (ref 13.2–17.1)
Lymphs Abs: 1412 cells/uL (ref 850–3900)
MCH: 28.5 pg (ref 27.0–33.0)
MCHC: 33.3 g/dL (ref 32.0–36.0)
MCV: 85.8 fL (ref 80.0–100.0)
MPV: 10.2 fL (ref 7.5–12.5)
Monocytes Relative: 12.5 %
Neutro Abs: 2489 cells/uL (ref 1500–7800)
Neutrophils Relative %: 54.1 %
Platelets: 218 10*3/uL (ref 140–400)
RBC: 5.15 10*6/uL (ref 4.20–5.80)
RDW: 13.8 % (ref 11.0–15.0)
Total Lymphocyte: 30.7 %
WBC: 4.6 10*3/uL (ref 3.8–10.8)

## 2021-05-03 LAB — COMPLETE METABOLIC PANEL WITH GFR
AG Ratio: 1.6 (calc) (ref 1.0–2.5)
ALT: 16 U/L (ref 9–46)
AST: 13 U/L (ref 10–35)
Albumin: 4.4 g/dL (ref 3.6–5.1)
Alkaline phosphatase (APISO): 39 U/L (ref 35–144)
BUN: 18 mg/dL (ref 7–25)
CO2: 35 mmol/L — ABNORMAL HIGH (ref 20–32)
Calcium: 10.2 mg/dL (ref 8.6–10.3)
Chloride: 100 mmol/L (ref 98–110)
Creat: 1.24 mg/dL (ref 0.70–1.35)
Globulin: 2.8 g/dL (calc) (ref 1.9–3.7)
Glucose, Bld: 88 mg/dL (ref 65–99)
Potassium: 4.3 mmol/L (ref 3.5–5.3)
Sodium: 141 mmol/L (ref 135–146)
Total Bilirubin: 0.6 mg/dL (ref 0.2–1.2)
Total Protein: 7.2 g/dL (ref 6.1–8.1)
eGFR: 65 mL/min/{1.73_m2} (ref >=60)

## 2021-05-03 LAB — URINALYSIS, ROUTINE W REFLEX MICROSCOPIC
Bilirubin Urine: NEGATIVE
Glucose, UA: NEGATIVE
Hgb urine dipstick: NEGATIVE
Ketones, ur: NEGATIVE
Leukocytes,Ua: NEGATIVE
Nitrite: NEGATIVE
Protein, ur: NEGATIVE
Specific Gravity, Urine: 1.02 (ref 1.001–1.035)
pH: 7 (ref 5.0–8.0)

## 2021-05-03 LAB — LIPID PANEL
Cholesterol: 177 mg/dL (ref <200)
HDL: 37 mg/dL — ABNORMAL LOW (ref >=40)
LDL Cholesterol (Calc): 103 mg/dL (calc) — ABNORMAL HIGH
Non-HDL Cholesterol (Calc): 140 mg/dL (calc) — ABNORMAL HIGH (ref <130)
Total CHOL/HDL Ratio: 4.8 (calc) (ref <5.0)
Triglycerides: 260 mg/dL — ABNORMAL HIGH (ref <150)

## 2021-05-03 LAB — TSH: TSH: 0.94 mIU/L (ref 0.40–4.50)

## 2021-05-03 LAB — MICROALBUMIN / CREATININE URINE RATIO
Creatinine, Urine: 122 mg/dL (ref 20–320)
Microalb Creat Ratio: 2 mcg/mg creat (ref <30)
Microalb, Ur: 0.2 mg/dL

## 2021-05-03 LAB — VITAMIN D 25 HYDROXY (VIT D DEFICIENCY, FRACTURES): Vit D, 25-Hydroxy: 71 ng/mL (ref 30–100)

## 2021-05-03 LAB — MAGNESIUM: Magnesium: 2 mg/dL (ref 1.5–2.5)

## 2021-05-03 LAB — PSA: PSA: 1.06 ng/mL (ref <=4.00)

## 2021-05-03 LAB — HEMOGLOBIN A1C
Hgb A1c MFr Bld: 6 % of total Hgb — ABNORMAL HIGH (ref <5.7)
Mean Plasma Glucose: 126 mg/dL
eAG (mmol/L): 7 mmol/L

## 2021-05-03 LAB — INSULIN, RANDOM: Insulin: 49 u[IU]/mL — ABNORMAL HIGH

## 2021-05-04 NOTE — Progress Notes (Signed)
============================================================ °============================================================ ° °-    Total Chol = 177 ; Excellent   - Very low risk for Heart Attack  / Stroke ============================================================ ============================================================  - But the bad /Dangerous LDL Chol = 103              (  Ideal or Goal is less than 70)   - Need to work harder on Diet !   - Cholesterol is too high - Recommend low cholesterol diet   - Cholesterol only comes from animal sources  - ie. meat, dairy, egg yolks  - Eat all the vegetables you want.  - Avoid meat, especially red meat - Beef AND Pork .  - Avoid cheese & dairy - milk & ice cream.     - Cheese is the most concentrated form of trans-fats which  is the worst thing to clog up our arteries.   - Veggie cheese is OK which can be found in the fresh  produce section at [employer] or [employer] or [employer] =============================================================== ===============================================================  - Also recommend eat # 4 [place] nuts each week   (( One study showed eating just #4 [place] nuts once month caused a 32% drop   in the bad LDL Cholesterol for 1 month   - which is better results than get with taking a cholesterol medicine every day ! ))  - So, I suggest eating # 4 [place] nuts once / week & recheck Chol at 3 month OV   =============================================================== ===============================================================  - Also Triglycerides (   260   ) or fats in blood are too high  (goal is less than 150)    - Recommend avoid fried & greasy foods,  sweets / candy,   - Avoid white rice  (brown or wild rice or Quinoa is OK),   - Avoid white potatoes  (sweet potatoes are OK)   - Avoid anything made from white flour  - bagels, doughnuts, rolls, buns, biscuits, white  and   wheat breads, pizza crust and traditional  pasta made of white flour & egg white  - (vegetarian pasta or spinach or wheat pasta is OK).    - Multi-grain bread is OK - like multi-grain flat bread or  sandwich thins.   - Avoid alcohol in excess.   - Exercise is also important. ============================================================ ============================================================  -  PSA - Low  - Great ! ============================================================ ============================================================  -  Vitamin D = 71 - Excellent - Please continue dose same  ============================================================ ============================================================  -  All Else - CBC - Kidneys - Electrolytes - Liver - Magnesium & Thyroid    - all  Normal / OK ============================================================

## 2021-08-01 ENCOUNTER — Other Ambulatory Visit: Payer: Self-pay | Admitting: Nurse Practitioner

## 2021-08-05 ENCOUNTER — Ambulatory Visit: Payer: BC Managed Care – PPO | Admitting: Nurse Practitioner

## 2021-08-06 ENCOUNTER — Encounter: Payer: Self-pay | Admitting: Nurse Practitioner

## 2021-08-06 ENCOUNTER — Ambulatory Visit: Payer: BC Managed Care – PPO | Admitting: Nurse Practitioner

## 2021-08-06 VITALS — BP 112/74 | HR 73 | Temp 97.7°F | Wt 227.8 lb

## 2021-08-06 DIAGNOSIS — Z79899 Other long term (current) drug therapy: Secondary | ICD-10-CM

## 2021-08-06 DIAGNOSIS — R7309 Other abnormal glucose: Secondary | ICD-10-CM

## 2021-08-06 DIAGNOSIS — N529 Male erectile dysfunction, unspecified: Secondary | ICD-10-CM | POA: Diagnosis not present

## 2021-08-06 DIAGNOSIS — Z0001 Encounter for general adult medical examination with abnormal findings: Secondary | ICD-10-CM | POA: Diagnosis not present

## 2021-08-06 DIAGNOSIS — Z87891 Personal history of nicotine dependence: Secondary | ICD-10-CM

## 2021-08-06 DIAGNOSIS — Z0000 Encounter for general adult medical examination without abnormal findings: Secondary | ICD-10-CM

## 2021-08-06 DIAGNOSIS — I10 Essential (primary) hypertension: Secondary | ICD-10-CM | POA: Diagnosis not present

## 2021-08-06 DIAGNOSIS — K648 Other hemorrhoids: Secondary | ICD-10-CM

## 2021-08-06 DIAGNOSIS — E782 Mixed hyperlipidemia: Secondary | ICD-10-CM

## 2021-08-06 DIAGNOSIS — K219 Gastro-esophageal reflux disease without esophagitis: Secondary | ICD-10-CM

## 2021-08-06 DIAGNOSIS — E669 Obesity, unspecified: Secondary | ICD-10-CM

## 2021-08-06 DIAGNOSIS — R6889 Other general symptoms and signs: Secondary | ICD-10-CM

## 2021-08-06 DIAGNOSIS — K5790 Diverticulosis of intestine, part unspecified, without perforation or abscess without bleeding: Secondary | ICD-10-CM

## 2021-08-06 DIAGNOSIS — E559 Vitamin D deficiency, unspecified: Secondary | ICD-10-CM

## 2021-08-06 DIAGNOSIS — I700 Atherosclerosis of aorta: Secondary | ICD-10-CM

## 2021-08-06 DIAGNOSIS — J302 Other seasonal allergic rhinitis: Secondary | ICD-10-CM

## 2021-08-06 NOTE — Progress Notes (Signed)
MEDICARE ANNUAL WELLNESS VISIT AND FOLLOW UP ?Assessment:  ? ?Bolton was seen today for follow-up and medicare wellness. ? ?Diagnoses and all orders for this visit: ? ?1. Encounter for Medicare annual wellness exam ?Due Annually  ? ?2. Aortic atherosclerosis (HCC) ?Start Omega 3 Fish Oil OTC ?Continue lifestyle modifications ?Diet and exercise ? ?- Lipid panel ? ?3. Hyperlipidemia, mixed ?Mild elevations currently treated by lifestyle ?LDL goal <100 ?Discussed low fat diet, increase fiber, weight loss encouraged ?Start Omega 3 Fish Oil OTC ?Continue lifestyle modifications ?Diet and exercise ? ?- Lipid panel ? ?4. Primary hypertension ?Continue medication ?Monitor blood pressure at home; call if consistently over 130/80 ?Continue DASH diet.   ?Reminder to go to the ER if any CP, SOB, nausea, dizziness, severe HA, changes vision/speech, left arm numbness and tingling and jaw pain. ? ?- CBC with Differential/Platelet ?- COMPLETE METABOLIC PANEL WITH GFR ?- Magnesium ? ?5. Abnormal glucose (Prediabetes) ?Discussed disease and risks ?Discussed diet/exercise, weight management  ?A1C q63m - last A1C 6.0%04/2021 ? ? ?6. Erectile dysfunction, unspecified erectile dysfunction type ?Tadalafil (CIALIS) 20 MG tablet ? ?7. Obesity (BMI 30.0-34.9) ?Long discussion about weight loss, diet, and exercise ?Recommended diet heavy in fruits and veggies and low in animal meats, cheeses, and dairy products, appropriate calorie intake ?Patient will work on cutting out soda, walking ? ?- CBC with Differential/Platelet ?- COMPLETE METABOLIC PANEL WITH GFR ? ?8. Seasonal allergic rhinitis, unspecified trigger ?Continue alternating Claritin and Allegra ? ?9. Vitamin D deficiency ?At goal at recent check; continue to recommend supplementation for goal of 60-100 ? ? ?10. Medication management ? ?- CBC with Differential/Platelet ?- COMPLETE METABOLIC PANEL WITH GFR ?- Magnesium ? ?11. Former smoker (quit 1996, 56 pack year history) ?Continue  annual screening. ? ?12. Diverticulosis ?Continue to monitor. ? ?13. Internal hemorrhoids ?Continue to monitor.  ? ?14.  Gastroesophageal reflux disease, unspecified whether esophagitis present ?Continue Prilosec   ?Well managed on current medications ?Discussed diet, avoiding triggers and other lifestyle changes ? ? ?Over 30 minutes of exam, counseling, chart review, and critical decision making was performed ? ?Future Appointments  ?Date Time Provider Department Center  ?05/07/2022 11:00 AM Lucky Cowboy, MD [facility] None  ?08/07/2022  9:00 AM Adela Glimpse, NP [facility] None  ? ? ? ?Plan:  ? ?During the course of the visit the patient was educated and counseled about appropriate screening and preventive services including:  ? ?Pneumococcal vaccine  ?Influenza vaccine ?Prevnar 13 ?Td vaccine ?Screening electrocardiogram ?Colorectal cancer screening ?Diabetes screening ?Glaucoma screening ?Nutrition counseling  ? ? ?Subjective:  ?Wesley Oneal is a 66 y.o. male who presents for Medicare Annual Wellness Visit and 6 month follow up for HTN, hyperlipidemia, prediabetes, and vitamin D Def.  ? ?Former smoker, 54 pack year, quit in 1996. Denies any new recent cough, DOE, fatigue.  ? ?Seasonal allergic rhinitis is treated by alternating Claritin and Allega.   No recent flares.  Currently effective.  ? ?GERD well controlled by diet and Prilosec OTC.  No longer taking Omeprazole. ? ?He is questioning whether he should stop magnesium supplement.   ? ?He is continuing Cialis for tmt of ED.   ? ?He had a +Cologuard 08/2020.  Completed Colonoscopy 12/2020. Results revealed one 7 mm polyp in the proximal transverse: removed and one 15 mm polyp in the sigmoid colon: removed, diverticulosis, internal hemorrhoids. Patient reports pathology was negative.  ? ?Reports medication compliance.  ? ?BMI is Body mass index is 34.13 kg/m?., he has been  working on diet and exercise, has reduced faty foods, sodas.  He is down 9 lb since  04/2021.  ?Wt Readings from Last 3 Encounters:  ?08/06/21 227 lb 12.8 oz (103.3 kg)  ?05/02/21 236 lb 12.8 oz (107.4 kg)  ?08/03/20 234 lb (106.1 kg)  ? ?His blood pressure has been controlled at home, today their BP is BP: 112/74 ?He works outside in the yard daily . He denies chest pain, shortness of breath, dizziness. HTN is treated with Atenolol, HCTZ, Losartan.   ?  ?He is not on cholesterol medication and denies myalgias. His cholesterol is not at goal. The cholesterol last visit was:   ?Lab Results  ?Component Value Date  ? CHOL 177 05/02/2021  ? HDL 37 (L) 05/02/2021  ? LDLCALC 103 (H) 05/02/2021  ? TRIG 260 (H) 05/02/2021  ? CHOLHDL 4.8 05/02/2021  ? ?He has been working on diet and exercise for prediabetes, and denies increased appetite, nausea, paresthesia of the feet, polydipsia, polyuria and visual disturbances. Last A1C in the office was:  ?Lab Results  ?Component Value Date  ? HGBA1C 6.0 (H) 05/02/2021  ? ?Last GFR ?Lab Results  ?Component Value Date  ? GFRNONAA 69 08/03/2020  ? ? Patient is on Vitamin D supplement.   ?Lab Results  ?Component Value Date  ? VD25OH 71 05/02/2021  ?   ? ?Medication Review: ? ? ?Current Outpatient Medications (Cardiovascular):  ?  atenolol (TENORMIN) 100 MG tablet, TAKE 1 TABLET BY MOUTH DAILY FOR BLOOD PRESSURE ?  hydrochlorothiazide (HYDRODIURIL) 25 MG tablet, TAKE 1 TABLET BY MOUTH DAILY AS NEEDED FOR BLOOD PRESSURE AND FLUID RETENTION/ANKLE SWELLING ?  losartan (COZAAR) 50 MG tablet, TAKE 1 TABLET BY MOUTH DAILY FOR BLOOD PRESSURE ?  minoxidil (LONITEN) 10 MG tablet, TAKE 1/2 TO 1 TABLET BY MOUTH DAILY FOR BLOOD PRESSURE ?  tadalafil (CIALIS) 20 MG tablet, Take 1/2 to 1 tablet every 2 to 3 days if needed for XXXX ? ?Current Outpatient Medications (Respiratory):  ?  fexofenadine (ALLEGRA) 180 MG tablet, Take 1 tablet (180 mg total) by mouth daily. (Patient not taking: Reported on 05/02/2021) ? ?Current Outpatient Medications (Analgesics):  ?  aspirin EC 81 MG tablet,  Take   1 tablet    Daily ? ? ?Current Outpatient Medications (Other):  ?  Cholecalciferol (VITAMIN D PO), Take 5,000 Units by mouth daily. ?  Magnesium 500 MG TABS, Take 1 tablet by mouth daily. ?  Omeprazole Magnesium (PRILOSEC OTC PO), Take 20 mg by mouth daily. ?  zinc gluconate 50 MG tablet, Take 50 mg by mouth daily. ?  Multiple Vitamins-Minerals (HAIR SKIN AND NAILS FORMULA PO), Take by mouth daily. (Patient not taking: Reported on 08/03/2020) ? ?Allergies: ?Allergies  ?Allergen Reactions  ? Lisinopril Cough  ? Penicillins   ? ? ?Current Problems (verified) ?has HTN (hypertension); Hyperlipidemia, mixed; Obesity (BMI 30.0-34.9); Vitamin D deficiency; Medication management; GERD (gastroesophageal reflux disease); Abnormal glucose (Prediabetes); FHx: colon cancer; Former smoker (quit 1996, 56 pack year history); Seasonal allergic rhinitis; ED (erectile dysfunction); and Aortic atherosclerosis (HCC) on their problem list. ? ?Screening Tests ?Immunization History  ?Administered Date(s) Administered  ? Moderna Sars-Covid-2 Vaccination 09/06/2019, 10/06/2019  ? PPD Test 05/25/2015, 06/25/2016, 12/02/2018, 04/18/2020  ? Td 09/06/2014  ? ? ?Preventative care: ?Colonoscopy:  12/2020 ? ?Prior vaccinations: ?TD or Tdap: 2016  ?Influenza: declines   ?Pneumococcal: declines for now ?Prevnar13: -  ?Shingles/Zostavax: declines ?Covid 19: 2/2, moderna ? ?Names of Other Physician/Practitioners you currently use: ?1. [facility] for [facility] ?2. Eye Doctor:  2022  ?3. Dentist:  Full dentures ? ?Patient Care Team: ?Lucky Cowboy, MD as PCP - General (Internal Medicine) ? ?Surgical: ?He  has no past surgical history on file. ?Family ?His family history includes Cancer in his mother; Heart disease in his father; Hypertension in his father and mother. ?Social history  ?He reports that he quit smoking about 27 years ago. His smoking use included cigarettes. He started smoking about 53  years ago. He has a 54.00 pack-year smoking history. He has never used smokeless tobacco. He reports current alcohol use of about 24.0 standard drinks per week. He reports that he does not currently use drug

## 2021-08-07 LAB — CBC WITH DIFFERENTIAL/PLATELET
Absolute Monocytes: 563 cells/uL (ref 200–950)
Basophils Absolute: 39 cells/uL (ref 0–200)
Basophils Relative: 0.9 %
Eosinophils Absolute: 129 cells/uL (ref 15–500)
Eosinophils Relative: 3 %
HCT: 46.2 % (ref 38.5–50.0)
Hemoglobin: 15.7 g/dL (ref 13.2–17.1)
Lymphs Abs: 1458 cells/uL (ref 850–3900)
MCH: 29.6 pg (ref 27.0–33.0)
MCHC: 34 g/dL (ref 32.0–36.0)
MCV: 87 fL (ref 80.0–100.0)
MPV: 10.8 fL (ref 7.5–12.5)
Monocytes Relative: 13.1 %
Neutro Abs: 2111 cells/uL (ref 1500–7800)
Neutrophils Relative %: 49.1 %
Platelets: 210 10*3/uL (ref 140–400)
RBC: 5.31 10*6/uL (ref 4.20–5.80)
RDW: 13.4 % (ref 11.0–15.0)
Total Lymphocyte: 33.9 %
WBC: 4.3 10*3/uL (ref 3.8–10.8)

## 2021-08-07 LAB — COMPLETE METABOLIC PANEL WITH GFR
AG Ratio: 1.6 (calc) (ref 1.0–2.5)
ALT: 16 U/L (ref 9–46)
AST: 13 U/L (ref 10–35)
Albumin: 4.5 g/dL (ref 3.6–5.1)
Alkaline phosphatase (APISO): 44 U/L (ref 35–144)
BUN: 15 mg/dL (ref 7–25)
CO2: 31 mmol/L (ref 20–32)
Calcium: 9.5 mg/dL (ref 8.6–10.3)
Chloride: 100 mmol/L (ref 98–110)
Creat: 1.14 mg/dL (ref 0.70–1.35)
Globulin: 2.8 g/dL (calc) (ref 1.9–3.7)
Glucose, Bld: 98 mg/dL (ref 65–99)
Potassium: 4.2 mmol/L (ref 3.5–5.3)
Sodium: 140 mmol/L (ref 135–146)
Total Bilirubin: 0.8 mg/dL (ref 0.2–1.2)
Total Protein: 7.3 g/dL (ref 6.1–8.1)
eGFR: 71 mL/min/{1.73_m2} (ref >=60)

## 2021-08-07 LAB — LIPID PANEL
Cholesterol: 168 mg/dL (ref <200)
HDL: 39 mg/dL — ABNORMAL LOW (ref >=40)
LDL Cholesterol (Calc): 102 mg/dL (calc) — ABNORMAL HIGH
Non-HDL Cholesterol (Calc): 129 mg/dL (calc) (ref <130)
Total CHOL/HDL Ratio: 4.3 (calc) (ref <5.0)
Triglycerides: 176 mg/dL — ABNORMAL HIGH (ref <150)

## 2021-08-07 LAB — MAGNESIUM: Magnesium: 1.9 mg/dL (ref 1.5–2.5)

## 2021-10-11 ENCOUNTER — Other Ambulatory Visit: Payer: Self-pay | Admitting: Adult Health

## 2021-10-11 DIAGNOSIS — N529 Male erectile dysfunction, unspecified: Secondary | ICD-10-CM

## 2021-10-30 ENCOUNTER — Other Ambulatory Visit: Payer: Self-pay

## 2021-10-30 DIAGNOSIS — I10 Essential (primary) hypertension: Secondary | ICD-10-CM

## 2021-10-30 MED ORDER — HYDROCHLOROTHIAZIDE 25 MG PO TABS: ORAL_TABLET | ORAL | 3 refills | Status: DC

## 2021-10-30 MED ORDER — ATENOLOL 100 MG PO TABS: ORAL_TABLET | ORAL | 3 refills | Status: DC

## 2021-12-09 IMAGING — CR DG CHEST 2V
2 series · 2 of 2 positions shown · non-contrast
Comparison: Chest x-ray 08/04/2019.

CLINICAL DATA: 65-year-old male former smoker. COVID infection in
[place]. Currently asymptomatic.

EXAM:
CHEST - 2 VIEW

[w chest pa]
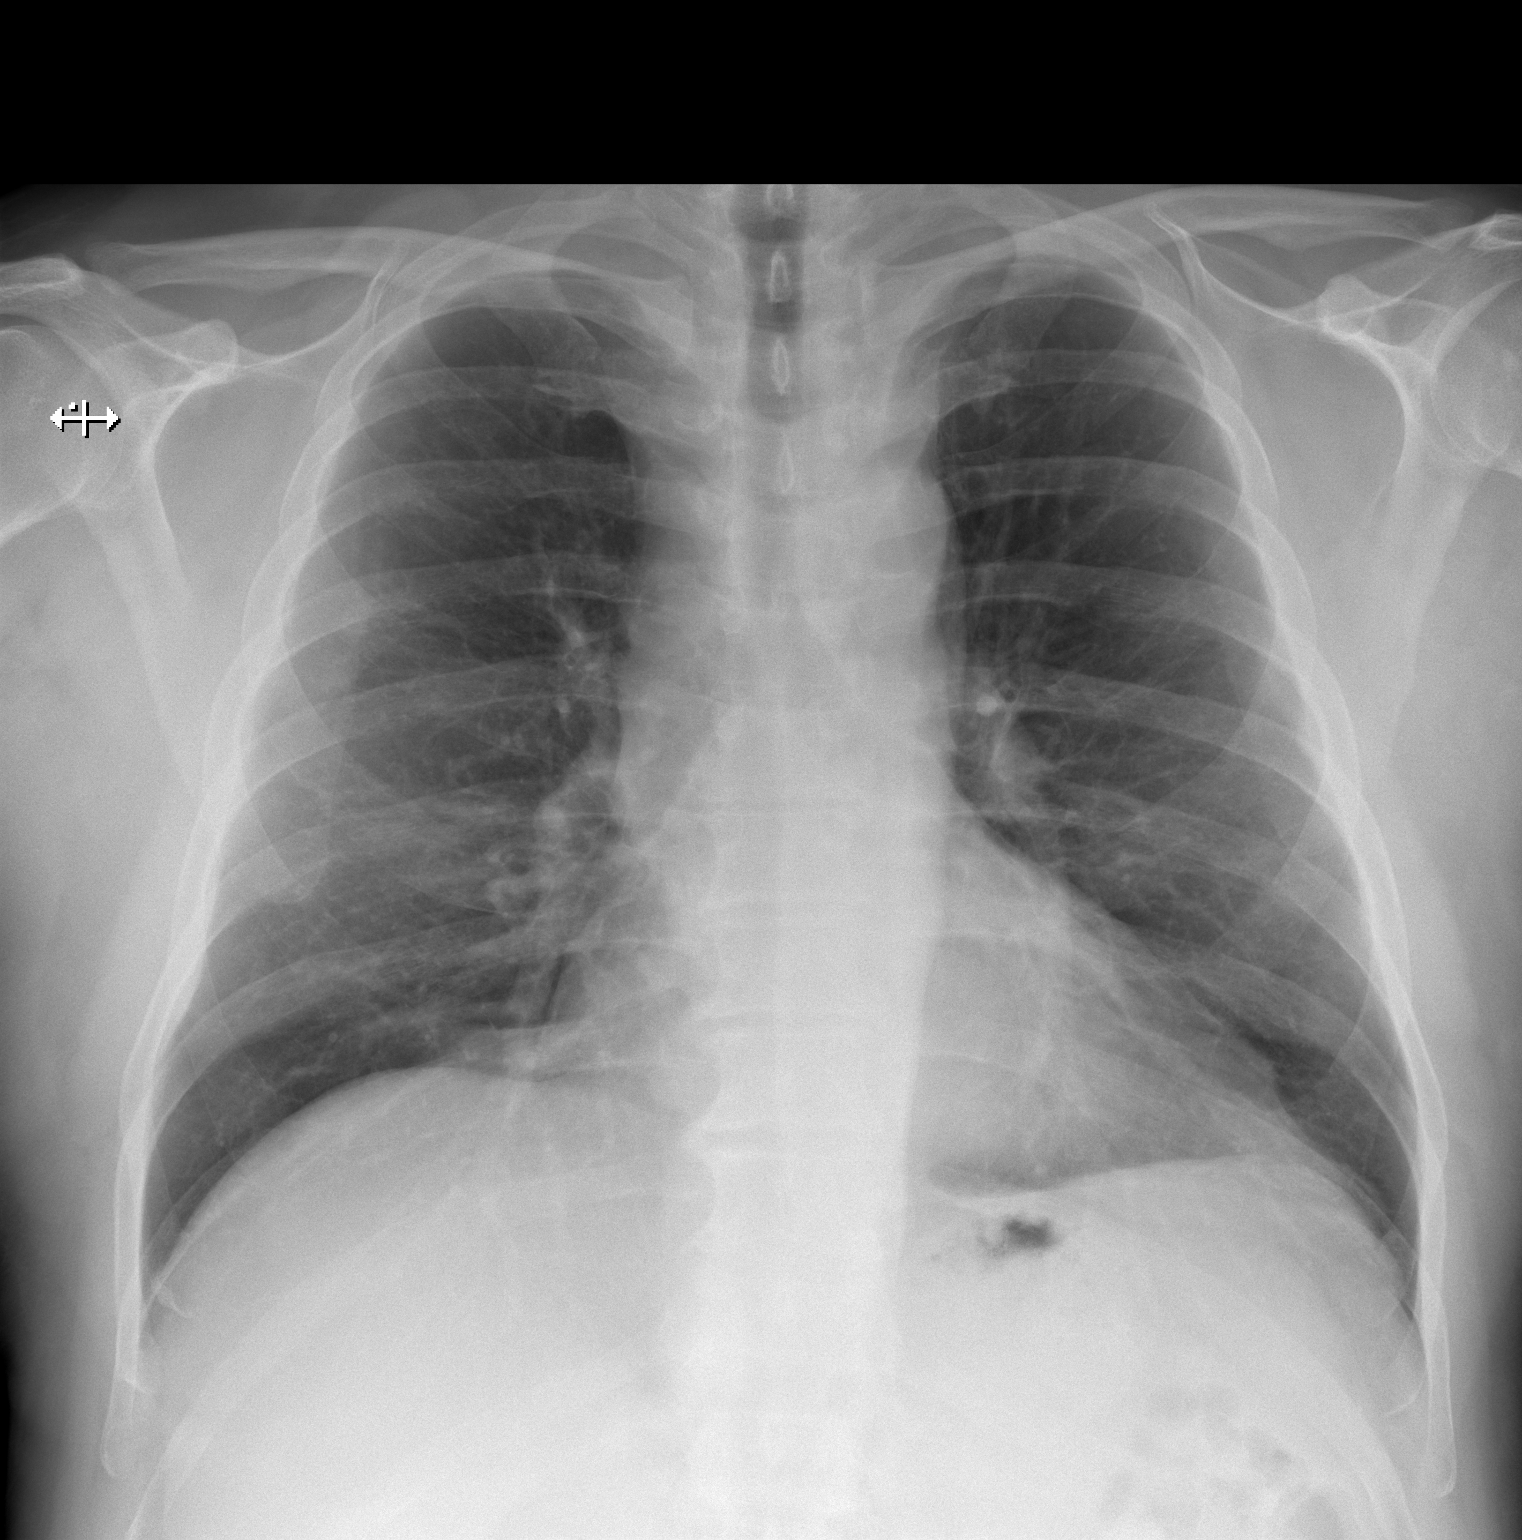

[w chest lat]
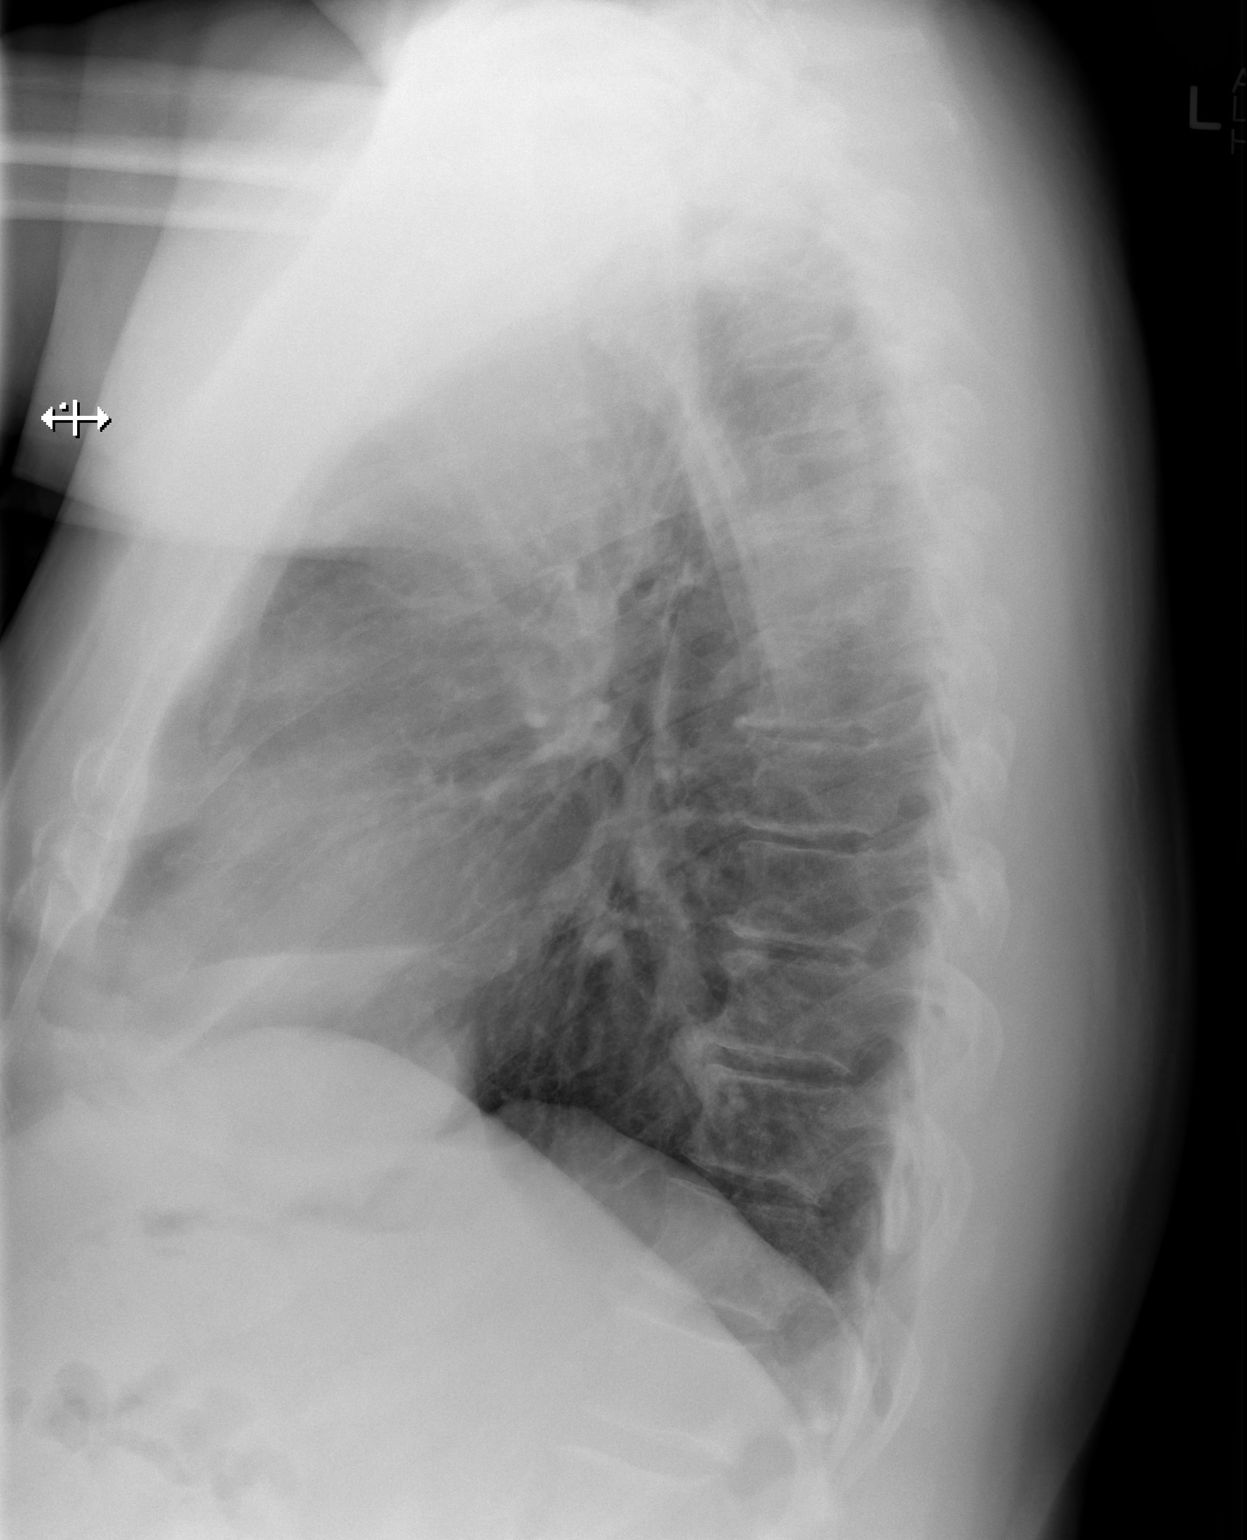

[2 of 2 positions shown; findings below may reference images not displayed]

FINDINGS: Previously noted patchy ill-defined opacities have largely resolved
in the mid to lower lungs bilaterally, with minimal residual
interstitial prominence. Lung volumes are normal. No consolidative
airspace disease. No pleural effusions. No pneumothorax. No
pulmonary nodule or mass noted. Pulmonary vasculature and the
cardiomediastinal silhouette are within normal limits.
Atherosclerotic calcifications in the thoracic aorta.
IMPRESSION: 1. Nearly resolved multilobar bilateral pneumonia.
2. Aortic atherosclerosis.

## 2022-01-06 ENCOUNTER — Other Ambulatory Visit: Payer: Self-pay | Admitting: Nurse Practitioner

## 2022-01-06 DIAGNOSIS — I10 Essential (primary) hypertension: Secondary | ICD-10-CM

## 2022-01-12 ENCOUNTER — Encounter: Payer: Self-pay | Admitting: Internal Medicine

## 2022-01-12 NOTE — Patient Instructions (Signed)

## 2022-01-12 NOTE — Progress Notes (Unsigned)
Future Appointments  Date Time Provider Department  01/13/2022          9 mo OV   9:30 AM Lucky Cowboy, MD [facility]  05/07/2022          CPE 11:00 AM Lucky Cowboy, MD [facility]  08/07/2022           Wellness  9:00 AM Adela Glimpse, NP [facility]    History of Present Illness:       This very nice 66 y.o. [name] presents for 9 month follow up with HTN, HLD, Pre-Diabetes and Vitamin D Deficiency. Patient has GERD controlled on OTC Prilosec       Patient is treated for HTN  since 2017 & BP has been controlled at home. Today's BP is at goal  - 138/78. Patient has had no complaints of any cardiac type chest pain, palpitations, dyspnea /orthopnea / PND, dizziness, claudication or dependent edema.       Hyperlipidemia is controlled with diet. Last Lipids were near goal :  Lab Results  Component Value Date   CHOL 168 08/06/2021   HDL 39 (L) 08/06/2021   LDLCALC 102 (H) 08/06/2021   TRIG 176 (H) 08/06/2021   CHOLHDL 4.3 08/06/2021     Also, the patient has history of  Moderate Obesity (BMI 33+)  & consequent PreDiabetes (A1c 6.1% /2017).  He has had no symptoms of reactive hypoglycemia, diabetic polys, paresthesias or visual blurring.  Last A1c was not at goal :  Lab Results  Component Value Date   HGBA1C 6.0 (H) 05/02/2021        Further, the patient also has history of Vitamin D Deficiency and supplements vitamin D . Last vitamin D was at goal:  Lab Results  Component Value Date   VD25OH 71 05/02/2021     Current Outpatient Medications on File Prior to Visit  Medication Sig   aspirin EC 81 MG  Take   1 tablet    Daily   atenolol 100 MG  Take  1 tablet  Daily    VITAMIN D 5,000 Units Take   daily.   hydrochlorothiazide 25 MG  TAKE 1 TABLET DAILY AS NEEDED    losartan  50 MG tablet TAKE 1 TABLET DAILY    Magnesium 500 MG  Take 1 tablet daily.   minoxidil 10 MG  TAKE 1/2 TO 1 TABLET DAILY   PRILOSEC 20 mg Take  daily.   tadalafil 20 MG  TAKE 1/2 TO ONE TABLET  EVERY 2 TO 3 DAYS IF NEEDED   zinc 50 MG t Take daily.      Allergies  Allergen Reactions   Lisinopril Cough   Penicillins      PMHx:   Past Medical History:  Diagnosis Date   Hyperlipidemia    Hypertension Feb 2017   Prediabetes Feb 2017    Vitamin D deficiency disease      Immunization History  Administered Date(s) Administered   Moderna Sars-Covid-2 Vaccination 09/06/2019, 10/06/2019   PPD Test 05/25/2015, 06/25/2016, 12/02/2018, 04/18/2020   Td 09/06/2014     No past surgical history on file.  FHx:    Reviewed / unchanged  SHx:    Reviewed / unchanged   Systems Review:  Constitutional: Denies fever, chills, wt changes, headaches, insomnia, fatigue, night sweats, change in appetite. Eyes: Denies redness, blurred vision, diplopia, discharge, itchy, watery eyes.  ENT: Denies discharge, congestion, post nasal drip, epistaxis, sore throat, earache, hearing loss, dental pain, tinnitus, vertigo, sinus pain,  snoring.  CV: Denies chest pain, palpitations, irregular heartbeat, syncope, dyspnea, diaphoresis, orthopnea, PND, claudication or edema. Respiratory: denies cough, dyspnea, DOE, pleurisy, hoarseness, laryngitis, wheezing.  Gastrointestinal: Denies dysphagia, odynophagia, heartburn, reflux, water brash, abdominal pain or cramps, nausea, vomiting, bloating, diarrhea, constipation, hematemesis, melena, hematochezia  or hemorrhoids. Genitourinary: Denies dysuria, frequency, urgency, nocturia, hesitancy, discharge, hematuria or flank pain. Musculoskeletal: Denies arthralgias, myalgias, stiffness, jt. swelling, pain, limping or strain/sprain.  Skin: Denies pruritus, rash, hives, warts, acne, eczema or change in skin lesion(s). Neuro: No weakness, tremor, incoordination, spasms, paresthesia or pain. Psychiatric: Denies confusion, memory loss or sensory loss. Endo: Denies change in weight, skin or hair change.  Heme/Lymph: No excessive bleeding, bruising or enlarged lymph  nodes.  Physical Exam  BP 138/78   Pulse 71   Temp 97.9 F (36.6 C)   Resp 16   Ht 5' 8.5" (1.74 m)   Wt 236 lb (107 kg)   SpO2 98%   BMI 35.36 kg/m   Appears  well nourished, well groomed  and in no distress.  Eyes: PERRLA, EOMs, conjunctiva no swelling or erythema. Sinuses: No frontal/maxillary tenderness ENT/Mouth: EAC's clear, TM's nl w/o erythema, bulging. Nares clear w/o erythema, swelling, exudates. Oropharynx clear without erythema or exudates. Oral hygiene is good. Tongue normal, non obstructing. Hearing intact.  Neck: Supple. Thyroid not palpable. Car 2+/2+ without bruits, nodes or JVD. Chest: Respirations nl with BS clear & equal w/o rales, rhonchi, wheezing or stridor.  Cor: Heart sounds normal w/ regular rate and rhythm without sig. murmurs, gallops, clicks or rubs. Peripheral pulses normal and equal  without edema.  Abdomen: Soft & bowel sounds normal. Non-tender w/o guarding, rebound, hernias, masses or organomegaly.  Lymphatics: Unremarkable.  Musculoskeletal: Full ROM all peripheral extremities, joint stability, 5/5 strength and normal gait.  Skin: Warm, dry without exposed rashes, lesions or ecchymosis apparent.  Neuro: Cranial nerves intact, reflexes equal bilaterally. Sensory-motor testing grossly intact. Tendon reflexes grossly intact.  Pysch: Alert & oriented x 3.  Insight and judgement nl & appropriate. No ideations.  Assessment and Plan:  1. Essential hypertension  - Continue medication, monitor blood pressure at home.  - Continue DASH diet.  Reminder to go to the ER if any CP,  SOB, nausea, dizziness, severe HA, changes vision/speech.   - CBC with Differential/Platelet - COMPLETE METABOLIC PANEL WITH GFR - Magnesium - TSH  2. Hyperlipidemia, mixed  - Continue diet/meds, exercise,& lifestyle modifications.  - Continue monitor periodic cholesterol/liver & renal functions    - Lipid panel - TSH  3. Abnormal glucose  - Continue diet, exercise   - Lifestyle modifications.  - Monitor appropriate labs   - Hemoglobin A1c - Insulin, random  4. Vitamin D deficiency  - Continue supplementation   - VITAMIN D 25 Hydroxy   5. Gastroesophageal reflux disease  - CBC with Differential/Platelet  6. Medication management  - CBC with Differential/Platelet - COMPLETE METABOLIC PANEL WITH GFR - Magnesium - Lipid panel - TSH - Hemoglobin A1c - Insulin, random - VITAMIN D 25 Hydroxy        Discussed  regular exercise, BP monitoring, weight control to achieve/maintain BMI less than 25 and discussed med and SE's. Recommended labs to assess /monitor clinical status .  I discussed the assessment and treatment plan with the patient. The patient was provided an opportunity to ask questions and all were answered. The patient agreed with the plan and demonstrated an understanding of the instructions.  I provided over 30 minutes of  exam, counseling, chart review and  complex critical decision making.        The patient was advised to call back or seek an in-person evaluation if the symptoms worsen or if the condition fails to improve as anticipated.   Marinus Maw, MD

## 2022-01-13 ENCOUNTER — Ambulatory Visit: Payer: BC Managed Care – PPO | Admitting: Internal Medicine

## 2022-01-13 ENCOUNTER — Encounter: Payer: Self-pay | Admitting: Internal Medicine

## 2022-01-13 VITALS — BP 138/78 | HR 71 | Temp 97.9°F | Resp 16 | Ht 68.5 in | Wt 236.0 lb

## 2022-01-13 DIAGNOSIS — I10 Essential (primary) hypertension: Secondary | ICD-10-CM

## 2022-01-13 DIAGNOSIS — E559 Vitamin D deficiency, unspecified: Secondary | ICD-10-CM | POA: Diagnosis not present

## 2022-01-13 DIAGNOSIS — K219 Gastro-esophageal reflux disease without esophagitis: Secondary | ICD-10-CM

## 2022-01-13 DIAGNOSIS — E782 Mixed hyperlipidemia: Secondary | ICD-10-CM | POA: Diagnosis not present

## 2022-01-13 DIAGNOSIS — R7309 Other abnormal glucose: Secondary | ICD-10-CM

## 2022-01-13 DIAGNOSIS — Z79899 Other long term (current) drug therapy: Secondary | ICD-10-CM | POA: Diagnosis not present

## 2022-01-13 MED ORDER — DEXAMETHASONE 4 MG PO TABS: ORAL_TABLET | ORAL | 1 refills | Status: DC

## 2022-01-14 LAB — VITAMIN D 25 HYDROXY (VIT D DEFICIENCY, FRACTURES): Vit D, 25-Hydroxy: 81 ng/mL (ref 30–100)

## 2022-01-14 LAB — CBC WITH DIFFERENTIAL/PLATELET
Absolute Monocytes: 573 cells/uL (ref 200–950)
Basophils Absolute: 28 cells/uL (ref 0–200)
Basophils Relative: 0.6 %
Eosinophils Absolute: 141 cells/uL (ref 15–500)
Eosinophils Relative: 3 %
HCT: 47.4 % (ref 38.5–50.0)
Hemoglobin: 16.2 g/dL (ref 13.2–17.1)
Lymphs Abs: 1344 cells/uL (ref 850–3900)
MCH: 29.5 pg (ref 27.0–33.0)
MCHC: 34.2 g/dL (ref 32.0–36.0)
MCV: 86.3 fL (ref 80.0–100.0)
MPV: 10.2 fL (ref 7.5–12.5)
Monocytes Relative: 12.2 %
Neutro Abs: 2613 cells/uL (ref 1500–7800)
Neutrophils Relative %: 55.6 %
Platelets: 214 10*3/uL (ref 140–400)
RBC: 5.49 10*6/uL (ref 4.20–5.80)
RDW: 13.5 % (ref 11.0–15.0)
Total Lymphocyte: 28.6 %
WBC: 4.7 10*3/uL (ref 3.8–10.8)

## 2022-01-14 LAB — INSULIN, RANDOM: Insulin: 18.7 u[IU]/mL — ABNORMAL HIGH

## 2022-01-14 LAB — HEMOGLOBIN A1C
Hgb A1c MFr Bld: 5.9 % of total Hgb — ABNORMAL HIGH (ref <5.7)
Mean Plasma Glucose: 123 mg/dL
eAG (mmol/L): 6.8 mmol/L

## 2022-01-14 LAB — COMPLETE METABOLIC PANEL WITH GFR
AG Ratio: 1.7 (calc) (ref 1.0–2.5)
ALT: 27 U/L (ref 9–46)
AST: 29 U/L (ref 10–35)
Albumin: 4.8 g/dL (ref 3.6–5.1)
Alkaline phosphatase (APISO): 38 U/L (ref 35–144)
BUN: 17 mg/dL (ref 7–25)
CO2: 30 mmol/L (ref 20–32)
Calcium: 9.9 mg/dL (ref 8.6–10.3)
Chloride: 99 mmol/L (ref 98–110)
Creat: 1.12 mg/dL (ref 0.70–1.35)
Globulin: 2.8 g/dL (calc) (ref 1.9–3.7)
Glucose, Bld: 98 mg/dL (ref 65–99)
Potassium: 4.2 mmol/L (ref 3.5–5.3)
Sodium: 139 mmol/L (ref 135–146)
Total Bilirubin: 0.7 mg/dL (ref 0.2–1.2)
Total Protein: 7.6 g/dL (ref 6.1–8.1)
eGFR: 72 mL/min/{1.73_m2} (ref >=60)

## 2022-01-14 LAB — MAGNESIUM: Magnesium: 2 mg/dL (ref 1.5–2.5)

## 2022-01-14 LAB — LIPID PANEL
Cholesterol: 190 mg/dL (ref <200)
HDL: 45 mg/dL (ref >=40)
LDL Cholesterol (Calc): 115 mg/dL (calc) — ABNORMAL HIGH
Non-HDL Cholesterol (Calc): 145 mg/dL (calc) — ABNORMAL HIGH (ref <130)
Total CHOL/HDL Ratio: 4.2 (calc) (ref <5.0)
Triglycerides: 187 mg/dL — ABNORMAL HIGH (ref <150)

## 2022-01-14 LAB — TSH: TSH: 1.25 mIU/L (ref 0.40–4.50)

## 2022-01-14 NOTE — Progress Notes (Signed)
<><><><><><><><><><><><><><><><><><><><><><><><><><><><><><><><><> <><><><><><><><><><><><><><><><><><><><><><><><><><><><><><><><><>  - Total Chol = 190 - Great,   But Sadly   Bad  / Dangerous LDL Chol = 115  - Dangerously high                         (  Ideal or Goal is less than 70   !  )     - Cholesterol only comes from animal sources                                                                              - ie. meat, dairy, egg yolks  - Eat all the vegetables you want.  - Avoid Meat, Avoid Meat,  Avoid Meat                                                           - especially Red Meat - Beef AND Pork  - Avoid cheese & dairy - milk & ice cream.     - Cheese is the most concentrated form of trans-fats which                                                          is the worst thing to clog up our arteries.   - Veggie cheese is OK which can be found in the fresh produce section at                                                                     [employer] or [employer] or [employer] <><><><><><><><><><><><><><><><><><><><><><><><><><><><><><><><><> <><><><><><><><><><><><><><><><><><><><><><><><><><><><><><><><><>  -  A1c = 5.9%  still too high   Blood sugar and A1c are STILL elevated in the borderline and                                                              early or pre-diabetes range which has the same   300% increased risk for heart attack, stroke, cancer and                                                alzheimer- type vascular dementia as full blown diabetes.   But the good news is that diet, exercise with weight loss can  cure the early diabetes at this point. <><><><><><><><><><><><><><><><><><><><><><><><><><><><><><><><><> <><><><><><><><><><><><><><><><><><><><><><><><><><><><><><><><><>  -  Vitamin D = 81 - Excellent   - Please keep dose same   <><><><><><><><><><><><><><><><><><><><><><><><><><><><><><><><><> <><><><><><><><><><><><><><><><><><><><><><><><><><><><><><><><><>  -  All Else - CBC - Kidneys - Electrolytes - Liver - Magnesium & Thyroid    - all  Normal / OK <><><><><><><><><><><><><><><><><><><><><><><><><><><><><><><><><> <><><><><><><><><><><><><><><><><><><><><><><><><><><><><><><><><>

## 2022-01-28 ENCOUNTER — Other Ambulatory Visit: Payer: Self-pay | Admitting: Nurse Practitioner

## 2022-01-28 DIAGNOSIS — I10 Essential (primary) hypertension: Secondary | ICD-10-CM

## 2022-05-07 ENCOUNTER — Encounter: Payer: BC Managed Care – PPO | Admitting: Internal Medicine

## 2022-05-19 NOTE — Progress Notes (Unsigned)
CPE Assessment:   Wesley Oneal was seen today for an annual CPE  Diagnoses and all orders for this visit:  CPE Due Annually  Health maintenance reviewed  Aortic atherosclerosis (HCC)/Hyperlipidema Controlled by lifestyle  Recommended diet heavy in fruits and veggies, omega 3's. Decrease consumption of animal meats, cheeses, and dairy products. Remain active and exercise as tolerated. Continue to monitor. Check lipids/TSH  Primary hypertension Continue Atenolol, HCTZ, Losartan, Minoxidil Discussed DASH (Dietary Approaches to Stop Hypertension) DASH diet is lower in sodium than a typical American diet. Cut back on foods that are high in saturated fat, cholesterol, and trans fats. Eat more whole-grain foods, fish, poultry, and nuts Remain active and exercise as tolerated daily.  Monitor BP at home-Call if greater than 130/80.  Check CMP/CBC   Abnormal glucose (Prediabetes) Education: Reviewed 'ABCs' of diabetes management  Discussed goals to be met and/or maintained include A1C (<7) Blood pressure (<130/80) Cholesterol (LDL <70) Continue Eye Exam yearly  Continue Dental Exam Q6 mo Discussed dietary recommendations Discussed Physical Activity recommendations Check A1C  Erectile dysfunction, unspecified erectile dysfunction type Tadalafil (CIALIS) 20 MG tablet  Obesity (BMI 30.0-34.9) Discussed appropriate BMI Diet modification. Physical activity. Encouraged/praised to build confidence.   Seasonal allergic rhinitis, unspecified trigger Continue alternating Claritin and Allegra Avoid triggers  Vitamin D deficiency At goal at recent check; continue to recommend supplementation for goal of 60-100 Monitor levels   Medication management All medications discussed and reviewed in full. All questions and concerns regarding medications addressed.     Former smoker (quit 1996, 56 pack year history) Continue to monitor  Diverticulosis No recent flare Continue to  monitor.  Internal hemorrhoids Controlled Daily stool softener PRN Stay well hydrated  Continue to monitor.   Gastroesophageal reflux disease, unspecified whether esophagitis present Continue Omperazole No suspected reflux complications ([place]/stricture). Lifestyle modification:  wt loss, avoid meals 2-3h before bedtime. Consider eliminating food triggers:  chocolate, caffeine, EtOH, acid/spicy food.  Screening for prostate cancer PSA  Screening for ischemic heart disease EKG  Screening for AAA [place]  Screening for hematuria/proteinuria UA/Microalbumin  Orders Placed This Encounter  Procedures   CBC with Differential/Platelet   COMPLETE METABOLIC PANEL WITH GFR   Magnesium   Lipid panel   TSH   Hemoglobin A1c   Insulin, random   VITAMIN D 25 Hydroxy (Vit-D Deficiency, Fractures)   Microalbumin / creatinine urine ratio   Urinalysis, Routine w reflex microscopic   PSA   [place], retroperitnl abd,  ltd   EKG 12-Lead   Notify office for further evaluation and treatment, questions or concerns if any reported s/s fail to improve.   The patient was advised to call back or seek an in-person evaluation if any symptoms worsen or if the condition fails to improve as anticipated.   Further disposition pending results of labs. Discussed med's effects and SE's.    I discussed the assessment and treatment plan with the patient. The patient was provided an opportunity to ask questions and all were answered. The patient agreed with the plan and demonstrated an understanding of the instructions.  Discussed med's effects and SE's. Screening labs and tests as requested with regular follow-up as recommended.  I provided 30 minutes of face-to-face time during this encounter including counseling, chart review, and critical decision making was preformed.  Future Appointments  Date Time Provider [place]  08/07/2022  9:00 AM Darrol Jump, NP [facility] None  05/21/2023  9:00 AM  Darrol Jump, NP [facility] None    Subjective:  Wesley Oneal is a 67 y.o. male who presents for annual physical for HTN, hyperlipidemia, prediabetes, and vitamin D Def.   Overall he reports feeling well today.  Former smoker, 54 pack year, quit in 1996. Denies any new recent cough, DOE, fatigue.   Seasonal allergic rhinitis is treated by alternating Claritin and Allega.   No recent flares.  Currently effective.   GERD well controlled by diet and Prilosec OTC.  No longer taking Omeprazole.  He is continuing Cialis for tmt of ED.    He had a +Cologuard 08/2020.  Completed Colonoscopy 12/2020. Results revealed one 7 mm polyp in the proximal transverse: removed and one 15 mm polyp in the sigmoid colon: removed, diverticulosis, internal hemorrhoids. Patient reports pathology was negative.  Recall 10 years.  Due 2032.  Reports medication compliance.   BMI is Body mass index is 35.33 kg/m., he has been working on diet and exercise Wt Readings from Last 3 Encounters:  05/20/22 235 lb 12.8 oz (107 kg)  01/13/22 236 lb (107 kg)  08/06/21 227 lb 12.8 oz (103.3 kg)   His blood pressure has been controlled at home, today their BP is BP: 108/74 He works outside in the yard daily . He denies chest pain, shortness of breath, dizziness. HTN is treated with Atenolol, HCTZ, Losartan.     He is not on cholesterol medication and denies myalgias. His cholesterol is not at goal. The cholesterol last visit was:   Lab Results  Component Value Date   CHOL 190 01/13/2022   HDL 45 01/13/2022   LDLCALC 115 (H) 01/13/2022   TRIG 187 (H) 01/13/2022   CHOLHDL 4.2 01/13/2022   He has been working on diet and exercise for prediabetes, and denies increased appetite, nausea, paresthesia of the feet, polydipsia, polyuria and visual disturbances. Last A1C in the office was:  Lab Results  Component Value Date   HGBA1C 5.9 (H) 01/13/2022   Last GFR Lab Results  Component Value Date   GFRNONAA 69  08/03/2020    Patient is on Vitamin D supplement.   Lab Results  Component Value Date   VD25OH 81 01/13/2022     PSA values have been normal Lab Results  Component Value Date   PSA 1.06 05/02/2021   PSA 1.16 04/18/2020   PSA 1.0 12/02/2018      Medication Review:  Current Outpatient Medications (Endocrine & Metabolic):    dexamethasone (DECADRON) 4 MG tablet, Take 1 tab 3 x day - 3 days, then 2 x day - 3 days, then 1 tab daily  Current Outpatient Medications (Cardiovascular):    atenolol (TENORMIN) 100 MG tablet, Take  1 tablet  Daily  for  BP                                                       /                                                     TAKE  BY                                  MOUTH   hydrochlorothiazide (HYDRODIURIL) 25 MG tablet, TAKE 1 TABLET BY MOUTH DAILY AS NEEDED FOR BLOOD PRESSURE AND FLUID RETENTION/ANKLE SWELLING   losartan (COZAAR) 50 MG tablet, TAKE 1 TABLET BY MOUTH DAILY FOR BLOOD PRESSURE   minoxidil (LONITEN) 10 MG tablet, TAKE 1/2 TO 1 TABLET BY MOUTH DAILY FOR BLOOD PRESSURE   tadalafil (CIALIS) 20 MG tablet, TAKE 1/2 TO ONE TABLET BY MOUTH EVERY 2 TO 3 DAYS IF NEEDED FOR XXXX   Current Outpatient Medications (Analgesics):    aspirin EC 81 MG tablet, Take   1 tablet    Daily   Current Outpatient Medications (Other):    Cholecalciferol (VITAMIN D PO), Take 5,000 Units by mouth daily.   Magnesium 500 MG TABS, Take 1 tablet by mouth daily.   Omeprazole Magnesium (PRILOSEC OTC PO), Take 20 mg by mouth daily.   zinc gluconate 50 MG tablet, Take 50 mg by mouth daily.  Allergies: Allergies  Allergen Reactions   Lisinopril Cough   Penicillins     Current Problems (verified) has HTN (hypertension); Hyperlipidemia, mixed; Obesity (BMI 30.0-34.9); Vitamin D deficiency; Medication management; GERD (gastroesophageal reflux disease); Abnormal glucose (Prediabetes); FHx: colon cancer; Former smoker (quit 1996, 56 pack  year history); Seasonal allergic rhinitis; ED (erectile dysfunction); and Aortic atherosclerosis ([place]) on their problem list.  Screening Tests Immunization History  Administered Date(s) Administered   Moderna Sars-Covid-2 Vaccination 09/06/2019, 10/06/2019   PPD Test 05/25/2015, 06/25/2016, 12/02/2018, 04/18/2020   Td 09/06/2014    Preventative care: Colonoscopy:  12/2020 - Due 10 years  Prior vaccinations: TD or Tdap: 2016  Influenza: declines   Pneumococcal: declines for now [place]: - declines Shingles/Zostavax: declines Covid 19: 2/2, moderna  Names of Other Physician/Practitioners you currently use: 1. [facility] here for primary care 2. Eye Doctor:  [id number]. Dentist:  Full dentures  Patient Care Team: Unk Pinto, MD as PCP - General (Internal Medicine)  Surgical: He  has no past surgical history on file. Family His family history includes Cancer in his mother; Heart disease in his father; Hypertension in his father and mother. Social history  He reports that he quit smoking about 28 years ago. His smoking use included cigarettes. He started smoking about 54 years ago. He has a 54.00 pack-year smoking history. He has never used smokeless tobacco. He reports current alcohol use of about 24.0 standard drinks of alcohol per week. He reports that he does not currently use drugs.  Objective:   Today's Vitals   05/20/22 0901  BP: 108/74  Pulse: 73  Temp: 98.1 F (36.7 C)  SpO2: 96%  Weight: 235 lb 12.8 oz (107 kg)  Height: 5' 8.5" (1.74 m)   Body mass index is 35.33 kg/m.  General appearance: alert, no distress, WD/WN, male HEENT: normocephalic, sclerae anicteric, TMs pearly, nares patent, no discharge or erythema, pharynx normal Oral cavity: MMM, no lesions Neck: supple, no lymphadenopathy, no thyromegaly, no masses Heart: RRR, normal S1, S2, no murmurs Lungs: CTA bilaterally, no wheezes, rhonchi, or rales Abdomen:  +bs, soft, non tender, non distended, no masses, no hepatomegaly, no splenomegaly Musculoskeletal: nontender, no swelling, no obvious deformity Extremities: no edema, no cyanosis, no clubbing Pulses: 2+ symmetric, upper and lower extremities, normal cap refill Neurological: alert, oriented x 3, CN2-12  intact, strength normal upper extremities and lower extremities, sensation normal throughout, DTRs 2+ throughout, no cerebellar signs, gait normal Psychiatric: normal affect, behavior normal, pleasant   EKG: NSR AAA [place]: neg   Renea Schoonmaker, NP   05/20/2022

## 2022-05-20 ENCOUNTER — Ambulatory Visit (INDEPENDENT_AMBULATORY_CARE_PROVIDER_SITE_OTHER): Payer: BC Managed Care – PPO | Admitting: Nurse Practitioner

## 2022-05-20 ENCOUNTER — Encounter: Payer: Self-pay | Admitting: Nurse Practitioner

## 2022-05-20 VITALS — BP 108/74 | HR 73 | Temp 98.1°F | Ht 68.5 in | Wt 235.8 lb

## 2022-05-20 DIAGNOSIS — Z131 Encounter for screening for diabetes mellitus: Secondary | ICD-10-CM

## 2022-05-20 DIAGNOSIS — I700 Atherosclerosis of aorta: Secondary | ICD-10-CM

## 2022-05-20 DIAGNOSIS — K5790 Diverticulosis of intestine, part unspecified, without perforation or abscess without bleeding: Secondary | ICD-10-CM

## 2022-05-20 DIAGNOSIS — Z136 Encounter for screening for cardiovascular disorders: Secondary | ICD-10-CM

## 2022-05-20 DIAGNOSIS — R350 Frequency of micturition: Secondary | ICD-10-CM | POA: Diagnosis not present

## 2022-05-20 DIAGNOSIS — Z0000 Encounter for general adult medical examination without abnormal findings: Secondary | ICD-10-CM | POA: Diagnosis not present

## 2022-05-20 DIAGNOSIS — Z87891 Personal history of nicotine dependence: Secondary | ICD-10-CM

## 2022-05-20 DIAGNOSIS — E669 Obesity, unspecified: Secondary | ICD-10-CM

## 2022-05-20 DIAGNOSIS — K648 Other hemorrhoids: Secondary | ICD-10-CM

## 2022-05-20 DIAGNOSIS — N401 Enlarged prostate with lower urinary tract symptoms: Secondary | ICD-10-CM

## 2022-05-20 DIAGNOSIS — Z0001 Encounter for general adult medical examination with abnormal findings: Secondary | ICD-10-CM

## 2022-05-20 DIAGNOSIS — E559 Vitamin D deficiency, unspecified: Secondary | ICD-10-CM

## 2022-05-20 DIAGNOSIS — Z125 Encounter for screening for malignant neoplasm of prostate: Secondary | ICD-10-CM

## 2022-05-20 DIAGNOSIS — Z79899 Other long term (current) drug therapy: Secondary | ICD-10-CM

## 2022-05-20 DIAGNOSIS — E782 Mixed hyperlipidemia: Secondary | ICD-10-CM

## 2022-05-20 DIAGNOSIS — N529 Male erectile dysfunction, unspecified: Secondary | ICD-10-CM

## 2022-05-20 DIAGNOSIS — I10 Essential (primary) hypertension: Secondary | ICD-10-CM

## 2022-05-20 DIAGNOSIS — K219 Gastro-esophageal reflux disease without esophagitis: Secondary | ICD-10-CM

## 2022-05-20 DIAGNOSIS — Z13220 Encounter for screening for lipoid disorders: Secondary | ICD-10-CM

## 2022-05-20 DIAGNOSIS — R7309 Other abnormal glucose: Secondary | ICD-10-CM

## 2022-05-20 DIAGNOSIS — Z1389 Encounter for screening for other disorder: Secondary | ICD-10-CM

## 2022-05-20 DIAGNOSIS — Z1329 Encounter for screening for other suspected endocrine disorder: Secondary | ICD-10-CM

## 2022-05-22 LAB — COMPLETE METABOLIC PANEL WITH GFR
AG Ratio: 1.5 (calc) (ref 1.0–2.5)
ALT: 24 U/L (ref 9–46)
AST: 18 U/L (ref 10–35)
Albumin: 4.5 g/dL (ref 3.6–5.1)
Alkaline phosphatase (APISO): 38 U/L (ref 35–144)
BUN: 18 mg/dL (ref 7–25)
CO2: 32 mmol/L (ref 20–32)
Calcium: 10.1 mg/dL (ref 8.6–10.3)
Chloride: 101 mmol/L (ref 98–110)
Creat: 1.15 mg/dL (ref 0.70–1.35)
Globulin: 3 g/dL (calc) (ref 1.9–3.7)
Glucose, Bld: 107 mg/dL — ABNORMAL HIGH (ref 65–99)
Potassium: 4.3 mmol/L (ref 3.5–5.3)
Sodium: 143 mmol/L (ref 135–146)
Total Bilirubin: 0.5 mg/dL (ref 0.2–1.2)
Total Protein: 7.5 g/dL (ref 6.1–8.1)
eGFR: 70 mL/min/{1.73_m2} (ref >=60)

## 2022-05-22 LAB — CBC WITH DIFFERENTIAL/PLATELET
Absolute Monocytes: 562 cells/uL (ref 200–950)
Basophils Absolute: 32 cells/uL (ref 0–200)
Basophils Relative: 0.6 %
Eosinophils Absolute: 103 cells/uL (ref 15–500)
Eosinophils Relative: 1.9 %
HCT: 46.2 % (ref 38.5–50.0)
Hemoglobin: 15.8 g/dL (ref 13.2–17.1)
Lymphs Abs: 1339 cells/uL (ref 850–3900)
MCH: 29.5 pg (ref 27.0–33.0)
MCHC: 34.2 g/dL (ref 32.0–36.0)
MCV: 86.2 fL (ref 80.0–100.0)
MPV: 10.2 fL (ref 7.5–12.5)
Monocytes Relative: 10.4 %
Neutro Abs: 3364 cells/uL (ref 1500–7800)
Neutrophils Relative %: 62.3 %
Platelets: 223 10*3/uL (ref 140–400)
RBC: 5.36 10*6/uL (ref 4.20–5.80)
RDW: 13 % (ref 11.0–15.0)
Total Lymphocyte: 24.8 %
WBC: 5.4 10*3/uL (ref 3.8–10.8)

## 2022-05-22 LAB — URINALYSIS, ROUTINE W REFLEX MICROSCOPIC
Bilirubin Urine: NEGATIVE
Glucose, UA: NEGATIVE
Hgb urine dipstick: NEGATIVE
Ketones, ur: NEGATIVE
Leukocytes,Ua: NEGATIVE
Nitrite: NEGATIVE
Protein, ur: NEGATIVE
Specific Gravity, Urine: 1.021 (ref 1.001–1.035)
pH: 6 (ref 5.0–8.0)

## 2022-05-22 LAB — MICROALBUMIN / CREATININE URINE RATIO
Creatinine, Urine: 179 mg/dL (ref 20–320)
Microalb Creat Ratio: 8 mcg/mg creat (ref <30)
Microalb, Ur: 1.5 mg/dL

## 2022-05-22 LAB — HEMOGLOBIN A1C
Hgb A1c MFr Bld: 6 % of total Hgb — ABNORMAL HIGH (ref <5.7)
Mean Plasma Glucose: 126 mg/dL
eAG (mmol/L): 7 mmol/L

## 2022-05-22 LAB — TSH: TSH: 1.21 mIU/L (ref 0.40–4.50)

## 2022-05-22 LAB — INSULIN, RANDOM: Insulin: 27.1 u[IU]/mL — ABNORMAL HIGH

## 2022-05-22 LAB — LIPID PANEL
Cholesterol: 174 mg/dL (ref <200)
HDL: 47 mg/dL (ref >=40)
LDL Cholesterol (Calc): 104 mg/dL (calc) — ABNORMAL HIGH
Non-HDL Cholesterol (Calc): 127 mg/dL (calc) (ref <130)
Total CHOL/HDL Ratio: 3.7 (calc) (ref <5.0)
Triglycerides: 133 mg/dL (ref <150)

## 2022-05-22 LAB — MAGNESIUM: Magnesium: 1.9 mg/dL (ref 1.5–2.5)

## 2022-05-22 LAB — VITAMIN D 25 HYDROXY (VIT D DEFICIENCY, FRACTURES): Vit D, 25-Hydroxy: 82 ng/mL (ref 30–100)

## 2022-05-22 LAB — PSA: PSA: 1.69 ng/mL (ref <=4.00)

## 2022-07-27 ENCOUNTER — Other Ambulatory Visit: Payer: Self-pay | Admitting: Nurse Practitioner

## 2022-07-27 DIAGNOSIS — I10 Essential (primary) hypertension: Secondary | ICD-10-CM

## 2022-07-29 ENCOUNTER — Other Ambulatory Visit: Payer: Self-pay | Admitting: Nurse Practitioner

## 2022-07-29 DIAGNOSIS — N529 Male erectile dysfunction, unspecified: Secondary | ICD-10-CM

## 2022-07-29 MED ORDER — TADALAFIL 20 MG PO TABS: ORAL_TABLET | ORAL | 1 refills | Status: DC

## 2022-08-07 ENCOUNTER — Ambulatory Visit: Payer: BC Managed Care – PPO | Admitting: Nurse Practitioner

## 2022-08-14 ENCOUNTER — Other Ambulatory Visit: Payer: Self-pay | Admitting: Nurse Practitioner

## 2022-08-14 DIAGNOSIS — I10 Essential (primary) hypertension: Secondary | ICD-10-CM

## 2022-08-22 DIAGNOSIS — H2513 Age-related nuclear cataract, bilateral: Secondary | ICD-10-CM | POA: Diagnosis not present

## 2022-09-30 ENCOUNTER — Other Ambulatory Visit: Payer: Self-pay | Admitting: Nurse Practitioner

## 2022-09-30 DIAGNOSIS — I10 Essential (primary) hypertension: Secondary | ICD-10-CM

## 2022-11-21 ENCOUNTER — Ambulatory Visit: Payer: BC Managed Care – PPO | Admitting: Nurse Practitioner

## 2022-11-23 NOTE — Progress Notes (Unsigned)
Follow Up Assessment:   Wesley Oneal was seen today for a general follow up  Diagnoses and all orders for this visit:  Aortic atherosclerosis (HCC)/Hyperlipidema Controlled by lifestyle  Recommended diet heavy in fruits and veggies, omega 3's. Decrease consumption of animal meats, cheeses, and dairy products. Remain active and exercise as tolerated. Continue to monitor. Check lipids/TSH  Primary hypertension Continue Atenolol, HCTZ, Losartan, Minoxidil Discussed DASH (Dietary Approaches to Stop Hypertension) DASH diet is lower in sodium than a typical American diet. Cut back on foods that are high in saturated fat, cholesterol, and trans fats. Eat more whole-grain foods, fish, poultry, and nuts Remain active and exercise as tolerated daily.  Monitor BP at home-Call if greater than 130/80.  Check CMP/CBC   Abnormal glucose (Prediabetes) Education: Reviewed 'ABCs' of diabetes management  Discussed goals to be met and/or maintained include A1C (<7) Blood pressure (<130/80) Cholesterol (LDL <70) Continue Eye Exam yearly  Continue Dental Exam Q6 mo Discussed dietary recommendations Discussed Physical Activity recommendations Check A1C  Erectile dysfunction, unspecified erectile dysfunction type Tadalafil (CIALIS) 20 MG tablet  Obesity (BMI 30.0-34.9) Discussed appropriate BMI Diet modification. Physical activity. Encouraged/praised to build confidence.  Seasonal allergic rhinitis, unspecified trigger Continue alternating Claritin and Allegra Avoid triggers  Vitamin D deficiency At goal at recent check; continue to recommend supplementation for goal of 60-100 Monitor levels   Medication management All medications discussed and reviewed in full. All questions and concerns regarding medications addressed.    Former smoker (quit 1996, 56 pack year history) Continue to monitor  Diverticulosis No recent flare Continue to monitor.  Internal  hemorrhoids Controlled Daily stool softener PRN Stay well hydrated  Continue to monitor.   Gastroesophageal reflux disease, unspecified whether esophagitis present Continue Omperazole No suspected reflux complications (Barret/stricture). Lifestyle modification:  wt loss, avoid meals 2-3h before bedtime. Consider eliminating food triggers:  chocolate, caffeine, EtOH, acid/spicy food.  Orders Placed This Encounter  Procedures   CBC with Differential/Platelet   COMPLETE METABOLIC PANEL WITH GFR   Lipid panel   Hemoglobin A1c   Notify office for further evaluation and treatment, questions or concerns if any reported s/s fail to improve.   The patient was advised to call back or seek an in-person evaluation if any symptoms worsen or if the condition fails to improve as anticipated.   Further disposition pending results of labs. Discussed med's effects and SE's.    I discussed the assessment and treatment plan with the patient. The patient was provided an opportunity to ask questions and all were answered. The patient agreed with the plan and demonstrated an understanding of the instructions.  Discussed med's effects and SE's. Screening labs and tests as requested with regular follow-up as recommended.  I provided 25 minutes of face-to-face time during this encounter including counseling, chart review, and critical decision making was preformed.  Future Appointments  Date Time Provider Department Center  05/21/2023  9:00 AM Adela Glimpse, NP [facility] None    Subjective:  Wesley Oneal is a 67 y.o. male who presents for annual physical for HTN, hyperlipidemia, prediabetes, and vitamin D Def.   Overall he reports feeling well today.  He has just returned from vacation at [facility], fishing.    Former smoker, 54 pack year, quit in 1996. Denies any new recent cough, DOE, fatigue.   Seasonal allergic rhinitis is treated by alternating Claritin and Allega.   No recent  flares.  Currently effective.   GERD well controlled by diet and Prilosec OTC.  No longer taking Omeprazole.  He is continuing Cialis for tmt of ED.    He had a +Cologuard 08/2020.  Completed Colonoscopy 12/2020. Results revealed one 7 mm polyp in the proximal transverse: removed and one 15 mm polyp in the sigmoid colon: removed, diverticulosis, internal hemorrhoids. Patient reports pathology was negative.  Recall 5 years.    Has a hx of GERD well controlled with Omeprazole.  BMI is Body mass index is 36.56 kg/m., he has not been working on diet and exercise Wt Readings from Last 3 Encounters:  11/25/22 244 lb (110.7 kg)  05/20/22 235 lb 12.8 oz (107 kg)  01/13/22 236 lb (107 kg)   His blood pressure has been controlled at home, today their BP is BP: (!) 140/80 He works outside in the yard daily . He denies chest pain, shortness of breath, dizziness. HTN is treated with Atenolol, HCTZ, Losartan.     He is not on cholesterol medication and denies myalgias. His cholesterol is not at goal. The cholesterol last visit was:   Lab Results  Component Value Date   CHOL 174 05/20/2022   HDL 47 05/20/2022   LDLCALC 104 (H) 05/20/2022   TRIG 133 05/20/2022   CHOLHDL 3.7 05/20/2022   He has been working on diet and exercise for prediabetes, and denies increased appetite, nausea, paresthesia of the feet, polydipsia, polyuria and visual disturbances. Last A1C in the office was:  Lab Results  Component Value Date   HGBA1C 6.0 (H) 05/20/2022   Last GFR Lab Results  Component Value Date   [facility] 69 08/03/2020    Patient is on Vitamin D supplement.   Lab Results  Component Value Date   VD25OH 82 05/20/2022     PSA values have been normal Lab Results  Component Value Date   PSA 1.69 05/20/2022   PSA 1.06 05/02/2021   PSA 1.16 04/18/2020      Medication Review:   Current Outpatient Medications (Cardiovascular):    atenolol (TENORMIN) 100 MG tablet, TAKE 1 TABLET BY MOUTH DAILY  FOR BLOOD PRESSURE   hydrochlorothiazide (HYDRODIURIL) 25 MG tablet, TAKE 1 TABLET BY MOUTH DAILY AS NEEDED FOR BLOOD PRESSURE AND FLUID RETENTION/ANKLE SWELLING   losartan (COZAAR) 50 MG tablet, TAKE 1 TABLET BY MOUTH DAILY FOR BLOOD PRESSURE   minoxidil (LONITEN) 10 MG tablet, TAKE 1/2 TO 1 TABLET BY MOUTH DAILY FOR BLOOD PRESSURE   tadalafil (CIALIS) 20 MG tablet, TAKE 1/2 TO ONE TABLET BY MOUTH EVERY 2 TO 3 DAYS IF NEEDED FOR XXXX   Current Outpatient Medications (Analgesics):    aspirin EC 81 MG tablet, Take   1 tablet    Daily   Current Outpatient Medications (Other):    Cholecalciferol (VITAMIN D PO), Take 5,000 Units by mouth daily.   Magnesium 500 MG TABS, Take 1 tablet by mouth daily.   Multiple Vitamins-Minerals (HAIR SKIN NAILS PO), Take by mouth daily.   Omeprazole Magnesium (PRILOSEC OTC PO), Take 20 mg by mouth daily.   zinc gluconate 50 MG tablet, Take 50 mg by mouth daily. (Patient not taking: Reported on 11/25/2022)  Allergies: Allergies  Allergen Reactions   Lisinopril Cough   Penicillins     Current Problems (verified) has HTN (hypertension); Hyperlipidemia, mixed; Obesity (BMI 30.0-34.9); Vitamin D deficiency; Medication management; GERD (gastroesophageal reflux disease); Abnormal glucose (Prediabetes); FHx: colon cancer; Former smoker (quit 1996, 56 pack year history); Seasonal allergic rhinitis; ED (erectile dysfunction); and Aortic atherosclerosis (HCC) on their problem list.  Screening Tests  Immunization History  Administered Date(s) Administered   Moderna Sars-Covid-2 Vaccination 09/06/2019, 10/06/2019   PPD Test 05/25/2015, 06/25/2016, 12/02/2018, 04/18/2020   Td 09/06/2014   Patient Care Team: Lucky Cowboy, MD as PCP - General (Internal Medicine)  Surgical: He  has no past surgical history on file. Family His family history includes Cancer in his mother; Heart disease in his father; Hypertension in his father and mother. Social history  He  reports that he quit smoking about 28 years ago. His smoking use included cigarettes. He started smoking about 54 years ago. He has a 54 pack-year smoking history. He has never used smokeless tobacco. He reports current alcohol use of about 24.0 standard drinks of alcohol per week. He reports that he does not currently use drugs.  Objective:   Today's Vitals   11/25/22 1048  BP: (!) 140/80  Pulse: 74  Temp: 97.9 F (36.6 C)  SpO2: 98%  Weight: 244 lb (110.7 kg)  Height: 5' 8.5" (1.74 m)    Body mass index is 36.56 kg/m.  General appearance: alert, no distress, WD/WN, male HEENT: normocephalic, sclerae anicteric, TMs pearly, nares patent, no discharge or erythema, pharynx normal Oral cavity: MMM, no lesions Neck: supple, no lymphadenopathy, no thyromegaly, no masses Heart: RRR, normal S1, S2, no murmurs Lungs: CTA bilaterally, no wheezes, rhonchi, or rales Abdomen: +bs, soft, non tender, non distended, no masses, no hepatomegaly, no splenomegaly Musculoskeletal: nontender, no swelling, no obvious deformity Extremities: no edema, no cyanosis, no clubbing Pulses: 2+ symmetric, upper and lower extremities, normal cap refill Neurological: alert, oriented x 3, CN2-12 intact, strength normal upper extremities and lower extremities, sensation normal throughout, DTRs 2+ throughout, no cerebellar signs, gait normal Psychiatric: normal affect, behavior normal, pleasant   Adela Glimpse, NP   11/25/2022

## 2022-11-25 ENCOUNTER — Encounter: Payer: Self-pay | Admitting: Nurse Practitioner

## 2022-11-25 ENCOUNTER — Ambulatory Visit: Payer: BC Managed Care – PPO | Admitting: Nurse Practitioner

## 2022-11-25 VITALS — BP 140/80 | HR 74 | Temp 97.9°F | Ht 68.5 in | Wt 244.0 lb

## 2022-11-25 DIAGNOSIS — Z79899 Other long term (current) drug therapy: Secondary | ICD-10-CM

## 2022-11-25 DIAGNOSIS — I10 Essential (primary) hypertension: Secondary | ICD-10-CM

## 2022-11-25 DIAGNOSIS — I700 Atherosclerosis of aorta: Secondary | ICD-10-CM | POA: Diagnosis not present

## 2022-11-25 DIAGNOSIS — E559 Vitamin D deficiency, unspecified: Secondary | ICD-10-CM

## 2022-11-25 DIAGNOSIS — E669 Obesity, unspecified: Secondary | ICD-10-CM

## 2022-11-25 DIAGNOSIS — E782 Mixed hyperlipidemia: Secondary | ICD-10-CM

## 2022-11-25 DIAGNOSIS — R7309 Other abnormal glucose: Secondary | ICD-10-CM

## 2022-11-25 DIAGNOSIS — Z87891 Personal history of nicotine dependence: Secondary | ICD-10-CM

## 2022-11-25 DIAGNOSIS — J302 Other seasonal allergic rhinitis: Secondary | ICD-10-CM

## 2022-11-25 DIAGNOSIS — K219 Gastro-esophageal reflux disease without esophagitis: Secondary | ICD-10-CM

## 2022-11-25 DIAGNOSIS — K648 Other hemorrhoids: Secondary | ICD-10-CM

## 2022-11-25 DIAGNOSIS — N529 Male erectile dysfunction, unspecified: Secondary | ICD-10-CM

## 2022-11-25 DIAGNOSIS — K5790 Diverticulosis of intestine, part unspecified, without perforation or abscess without bleeding: Secondary | ICD-10-CM

## 2022-11-25 NOTE — Patient Instructions (Signed)

## 2022-11-26 LAB — COMPLETE METABOLIC PANEL WITH GFR
AG Ratio: 1.8 (calc) (ref 1.0–2.5)
ALT: 37 U/L (ref 9–46)
AST: 33 U/L (ref 10–35)
Albumin: 4.2 g/dL (ref 3.6–5.1)
Alkaline phosphatase (APISO): 37 U/L (ref 35–144)
BUN: 13 mg/dL (ref 7–25)
CO2: 28 mmol/L (ref 20–32)
Calcium: 9.5 mg/dL (ref 8.6–10.3)
Chloride: 103 mmol/L (ref 98–110)
Creat: 1.11 mg/dL (ref 0.70–1.35)
Globulin: 2.4 g/dL (ref 1.9–3.7)
Glucose, Bld: 150 mg/dL — ABNORMAL HIGH (ref 65–99)
Potassium: 4.1 mmol/L (ref 3.5–5.3)
Sodium: 139 mmol/L (ref 135–146)
Total Bilirubin: 0.6 mg/dL (ref 0.2–1.2)
Total Protein: 6.6 g/dL (ref 6.1–8.1)
eGFR: 73 mL/min/{1.73_m2} (ref >=60)

## 2022-11-26 LAB — LIPID PANEL
Cholesterol: 159 mg/dL (ref <200)
HDL: 42 mg/dL (ref >=40)
LDL Cholesterol (Calc): 87 mg/dL
Non-HDL Cholesterol (Calc): 117 mg/dL (ref <130)
Total CHOL/HDL Ratio: 3.8 (calc) (ref <5.0)
Triglycerides: 205 mg/dL — ABNORMAL HIGH (ref <150)

## 2022-11-26 LAB — CBC WITH DIFFERENTIAL/PLATELET
Absolute Monocytes: 455 {cells}/uL (ref 200–950)
Basophils Absolute: 29 {cells}/uL (ref 0–200)
Basophils Relative: 0.7 %
Eosinophils Absolute: 82 {cells}/uL (ref 15–500)
Eosinophils Relative: 2 %
HCT: 44.4 % (ref 38.5–50.0)
Hemoglobin: 14.7 g/dL (ref 13.2–17.1)
Lymphs Abs: 984 cells/uL (ref 850–3900)
MCH: 28.9 pg (ref 27.0–33.0)
MCHC: 33.1 g/dL (ref 32.0–36.0)
MCV: 87.2 fL (ref 80.0–100.0)
MPV: 10.4 fL (ref 7.5–12.5)
Monocytes Relative: 11.1 %
Neutro Abs: 2550 {cells}/uL (ref 1500–7800)
Neutrophils Relative %: 62.2 %
Platelets: 188 10*3/uL (ref 140–400)
RBC: 5.09 10*6/uL (ref 4.20–5.80)
RDW: 13.4 % (ref 11.0–15.0)
Total Lymphocyte: 24 %
WBC: 4.1 10*3/uL (ref 3.8–10.8)

## 2022-11-26 LAB — HEMOGLOBIN A1C
Hgb A1c MFr Bld: 6 %{Hb} — ABNORMAL HIGH (ref <5.7)
Mean Plasma Glucose: 126 mg/dL
eAG (mmol/L): 7 mmol/L

## 2023-01-02 ENCOUNTER — Other Ambulatory Visit: Payer: Self-pay | Admitting: Nurse Practitioner

## 2023-01-02 DIAGNOSIS — I10 Essential (primary) hypertension: Secondary | ICD-10-CM

## 2023-01-23 ENCOUNTER — Other Ambulatory Visit: Payer: Self-pay | Admitting: Nurse Practitioner

## 2023-04-04 ENCOUNTER — Other Ambulatory Visit: Payer: Self-pay | Admitting: Nurse Practitioner

## 2023-04-04 DIAGNOSIS — I10 Essential (primary) hypertension: Secondary | ICD-10-CM

## 2023-05-19 ENCOUNTER — Encounter: Payer: Self-pay | Admitting: *Deleted

## 2023-05-20 ENCOUNTER — Other Ambulatory Visit: Payer: Self-pay

## 2023-05-20 DIAGNOSIS — I10 Essential (primary) hypertension: Secondary | ICD-10-CM

## 2023-05-20 MED ORDER — MINOXIDIL 10 MG PO TABS: ORAL_TABLET | ORAL | 0 refills | Status: DC

## 2023-05-20 MED ORDER — LOSARTAN POTASSIUM 50 MG PO TABS
ORAL_TABLET | ORAL | 0 refills | Status: DC
Start: 2023-05-20 — End: 2023-08-17

## 2023-05-21 ENCOUNTER — Encounter: Payer: BC Managed Care – PPO | Admitting: Nurse Practitioner

## 2023-06-02 ENCOUNTER — Other Ambulatory Visit: Payer: Self-pay

## 2023-06-02 DIAGNOSIS — N529 Male erectile dysfunction, unspecified: Secondary | ICD-10-CM

## 2023-06-02 MED ORDER — TADALAFIL 20 MG PO TABS: ORAL_TABLET | ORAL | 1 refills | Status: AC

## 2023-07-08 ENCOUNTER — Ambulatory Visit: Payer: BC Managed Care – PPO | Admitting: Family Medicine

## 2023-07-08 ENCOUNTER — Encounter: Payer: Self-pay | Admitting: Family Medicine

## 2023-07-08 VITALS — BP 120/68 | HR 81 | Temp 98.2°F | Ht 68.5 in | Wt 236.0 lb

## 2023-07-08 DIAGNOSIS — Z0001 Encounter for general adult medical examination with abnormal findings: Secondary | ICD-10-CM | POA: Diagnosis not present

## 2023-07-08 DIAGNOSIS — Z6835 Body mass index (BMI) 35.0-35.9, adult: Secondary | ICD-10-CM

## 2023-07-08 DIAGNOSIS — I10 Essential (primary) hypertension: Secondary | ICD-10-CM

## 2023-07-08 DIAGNOSIS — Z0000 Encounter for general adult medical examination without abnormal findings: Secondary | ICD-10-CM

## 2023-07-08 DIAGNOSIS — E559 Vitamin D deficiency, unspecified: Secondary | ICD-10-CM

## 2023-07-08 DIAGNOSIS — I700 Atherosclerosis of aorta: Secondary | ICD-10-CM

## 2023-07-08 DIAGNOSIS — E782 Mixed hyperlipidemia: Secondary | ICD-10-CM

## 2023-07-08 DIAGNOSIS — K219 Gastro-esophageal reflux disease without esophagitis: Secondary | ICD-10-CM | POA: Diagnosis not present

## 2023-07-08 DIAGNOSIS — K5730 Diverticulosis of large intestine without perforation or abscess without bleeding: Secondary | ICD-10-CM | POA: Insufficient documentation

## 2023-07-08 NOTE — Assessment & Plan Note (Signed)
 HTN, HLD, prediabetes well controlled. Non-fasting labs today. I recommend consuming a heart healthy diet such as Mediterranean diet or DASH diet with whole grains, fruits, vegetable, fish, lean meats, nuts, and olive oil. Limit sweets and processed foods. I also encourage moderate intensity exercise 150 minutes weekly. This is 3-5 times weekly for 30-50 minutes each session. Goal should be pace of 3 miles/hours, or walking 1.5 miles in 30 minutes.

## 2023-07-08 NOTE — Progress Notes (Signed)
 New Patient Office Visit  Subjective    Patient ID: Wesley Oneal, male    DOB: December 10, 1955  Age: 68 y.o. MRN: [id number]  CC:  Chief Complaint  Patient presents with   Establish Care    Pt asks if he can take his Atenolol at night.     HPI Wesley Oneal presents to establish care and CPE. Oriented to practice routines and expectations. Has been seeing PCP regularly, last OV 11/25/22, CPE 05/20/2022. PMH includes aortic atherosclerosis, HLD, HTN, prediabetes, ED, GERD, Vitamin D deficiency. No concerns today. Mr Guilmette does admit eating a regular diet that is not the healthiest. No regular exercise routine. Admits drinking 1 beer daily with once weekly or once monthly 12 pack of beer. Does not feel guilt around this, need an eye opener, have problems at home or work, or have concerned family about how much he drinks.   HYPERTENSION / HYPERLIPIDEMIA Satisfied with current treatment? yes Duration of hypertension: chronic BP monitoring frequency: rarely BP range:  BP medication side effects: no Past BP meds: atenolol, HCTZ, and losartan (cozaar), minoxidil Duration of hyperlipidemia: chronic Cholesterol medication side effects: no Cholesterol supplements: none Past cholesterol medications: none Medication compliance: excellent compliance Aspirin: yes Recent stressors: no Recurrent headaches: no Visual changes: no Palpitations: no Dyspnea: no Chest pain: no Lower extremity edema: no Dizzy/lightheaded: no  GERD GERD control status: controlled Satisfied with current treatment? yes Heartburn frequency:  Medication side effects: no  Medication compliance: better Dysphagia: no Odynophagia:  no Hematemesis: no Blood in stool: no EGD: no  Health Maintenance  Topic Date Due   COVID-19 Vaccine (3 - Moderna risk series) 07/24/2023 (Originally 11/03/2019)   Zoster Vaccines- Shingrix (1 of 2) 10/07/2023 (Originally 05/21/1974)   Pneumonia Vaccine 12+ Years old (1 of 1 -  PCV) 07/07/2024 (Originally 05/21/2020)   INFLUENZA VACCINE  11/06/2023   DTaP/Tdap/Td (2 - Tdap) 09/05/2024   Colonoscopy  12/26/2030   Hepatitis C Screening  Completed   HPV VACCINES  Aged Out     Outpatient Encounter Medications as of 07/08/2023  Medication Sig   aspirin EC 81 MG tablet Take   1 tablet    Daily   atenolol (TENORMIN) 100 MG tablet TAKE 1 TABLET BY MOUTH DAILY FOR BLOOD PRESSURE   Cholecalciferol (VITAMIN D PO) Take 5,000 Units by mouth daily.   hydrochlorothiazide (HYDRODIURIL) 25 MG tablet TAKE 1 TABLET BY MOUTH DAILY AS NEEDED FOR BLOOD PRESSURE AND FLUID RETENTION OR ANKLE SWELLING   losartan (COZAAR) 50 MG tablet TAKE 1 TABLET BY MOUTH DAILY FOR BLOOD PRESSURE   Magnesium 500 MG TABS Take 1 tablet by mouth daily.   minoxidil (LONITEN) 10 MG tablet TAKE 1/2 TO 1 TABLET BY MOUTH DAILY FOR BLOOD PRESSURE   Multiple Vitamins-Minerals (HAIR SKIN NAILS PO) Take by mouth daily.   Omeprazole Magnesium (PRILOSEC OTC PO) Take 20 mg by mouth daily.   tadalafil (CIALIS) 20 MG tablet TAKE 1/2 TO ONE TABLET BY MOUTH EVERY 2 TO 3 DAYS IF NEEDED FOR XXXX   zinc gluconate 50 MG tablet Take 50 mg by mouth daily.   No facility-administered encounter medications on file as of 07/08/2023.    Past Medical History:  Diagnosis Date   Hyperlipidemia    Hypertension Feb 2017   Prediabetes Feb 2017    Vitamin D deficiency disease     History reviewed. No pertinent surgical history.  Family History  Problem Relation Age of Onset   Cancer Mother  Hypertension Mother    Hypertension Father    Heart disease Father     Social History   Socioeconomic History   Marital status: Married    Spouse name: Not on file   Number of children: Not on file   Years of education: Not on file   Highest education level: Not on file  Occupational History   Not on file  Tobacco Use   Smoking status: Former    Current packs/day: 0.00    Average packs/day: 1.5 packs/day for 36.0 years (54.0  ttl pk-yrs)    Types: Cigarettes    Start date: 14    Quit date: 58    Years since quitting: 29.2   Smokeless tobacco: Never  Substance and Sexual Activity   Alcohol use: Yes    Alcohol/week: 24.0 standard drinks of alcohol    Types: 24 Standard drinks or equivalent per week    Comment: 1 case of beer and 2 shots of vodka   Drug use: Not Currently   Sexual activity: Not on file  Other Topics Concern   Not on file  Social History Narrative   Not on file   Social Drivers of Health   Financial Resource Strain: Not on file  Food Insecurity: Not on file  Transportation Needs: Not on file  Physical Activity: Not on file  Stress: Not on file  Social Connections: Not on file  Intimate Partner Violence: Not on file    Review of Systems  Constitutional: Negative.   HENT: Negative.    Eyes:  Positive for blurred vision (cataracts).  Respiratory: Negative.    Cardiovascular: Negative.   Gastrointestinal: Negative.   Genitourinary: Negative.   Musculoskeletal: Negative.   Skin: Negative.   Neurological: Negative.   Endo/Heme/Allergies: Negative.   Psychiatric/Behavioral: Negative.    All other systems reviewed and are negative.       Objective    BP 120/68   Pulse 81   Temp 98.2 F (36.8 C)   Ht 5' 8.5" (1.74 m)   Wt 236 lb (107 kg)   SpO2 97%   BMI 35.36 kg/m     07/08/2023   10:42 AM 11/25/2022   10:48 AM 05/20/2022    9:01 AM  Vitals with BMI  Height 5' 8.5" 5' 8.5" 5' 8.5"  Weight 236 lbs 244 lbs 235 lbs 13 oz  BMI 35.36 36.56 35.33  Systolic 120 140 440  Diastolic 68 80 74  Pulse 81 74 73     Physical Exam Vitals and nursing note reviewed.  Constitutional:      Appearance: Normal appearance. He is obese.  HENT:     Head: Normocephalic and atraumatic.     Right Ear: Tympanic membrane, ear canal and external ear normal.     Left Ear: Tympanic membrane, ear canal and external ear normal.     Nose: Nose normal.     Mouth/Throat:     Mouth:  Mucous membranes are moist.     Dentition: Has dentures.     Pharynx: Oropharynx is clear.  Eyes:     Extraocular Movements: Extraocular movements intact.     Right eye: Normal extraocular motion and no nystagmus.     Left eye: Normal extraocular motion and no nystagmus.     Conjunctiva/sclera: Conjunctivae normal.     Pupils: Pupils are equal, round, and reactive to light.  Cardiovascular:     Rate and Rhythm: Normal rate and regular rhythm.     Pulses: Normal  pulses.     Heart sounds: Normal heart sounds.  Pulmonary:     Effort: Pulmonary effort is normal.     Breath sounds: Normal breath sounds.  Abdominal:     General: Bowel sounds are normal.     Palpations: Abdomen is soft.  Genitourinary:    Comments: Deferred using shared decision making Musculoskeletal:        General: Normal range of motion.     Cervical back: Normal range of motion and neck supple.  Skin:    General: Skin is warm and dry.     Capillary Refill: Capillary refill takes less than 2 seconds.  Neurological:     General: No focal deficit present.     Mental Status: He is alert. Mental status is at baseline.  Psychiatric:        Mood and Affect: Mood normal.        Speech: Speech normal.        Behavior: Behavior normal.        Thought Content: Thought content normal.        Cognition and Memory: Cognition and memory normal.        Judgment: Judgment normal.     Last CBC Lab Results  Component Value Date   WBC 4.1 11/25/2022   HGB 14.7 11/25/2022   HCT 44.4 11/25/2022   MCV 87.2 11/25/2022   MCH 28.9 11/25/2022   RDW 13.4 11/25/2022   PLT 188 11/25/2022   Last metabolic panel Lab Results  Component Value Date   GLUCOSE 150 (H) 11/25/2022   NA 139 11/25/2022   K 4.1 11/25/2022   CL 103 11/25/2022   CO2 28 11/25/2022   BUN 13 11/25/2022   CREATININE 1.11 11/25/2022   EGFR 73 11/25/2022   CALCIUM 9.5 11/25/2022   PROT 6.6 11/25/2022   ALBUMIN 3.6 08/04/2019   BILITOT 0.6 11/25/2022    ALKPHOS 30 (L) 08/04/2019   AST 33 11/25/2022   ALT 37 11/25/2022   ANIONGAP 13 08/04/2019   Last lipids Lab Results  Component Value Date   CHOL 159 11/25/2022   HDL 42 11/25/2022   LDLCALC 87 11/25/2022   TRIG 205 (H) 11/25/2022   CHOLHDL 3.8 11/25/2022   Last hemoglobin A1c Lab Results  Component Value Date   HGBA1C 6.0 (H) 11/25/2022   Last thyroid functions Lab Results  Component Value Date   TSH 1.21 05/20/2022   Last vitamin D Lab Results  Component Value Date   VD25OH 82 05/20/2022   Last vitamin B12 and Folate Lab Results  Component Value Date   VITAMINB12 469 04/18/2020        Assessment & Plan:   Problem List Items Addressed This Visit     HTN (hypertension)   Chronic well controlled on Atenolol, hydrochlorothiazide, Losartan, Minoxidil. Recommend weight management. Recommend heart healthy diet such as Mediterranean diet with whole grains, fruits, vegetable, fish, lean meats, nuts, and olive oil. Limit salt. Encouraged moderate walking, 3-5 times/week for 30-50 minutes each session. Aim for at least 150 minutes.week. Goal should be pace of 3 miles/hours, or walking 1.5 miles in 30 minutes. Avoid tobacco products. Avoid excess alcohol. Take medications as prescribed and bring medications and blood pressure log with cuff to each office visit. Seek medical care for chest pain, palpitations, shortness of breath with exertion, dizziness/lightheadedness, vision changes, recurrent headaches, or swelling of extremities. Follow up in 6 months or sooner if needed. Labs today.      Relevant Orders  CBC with Differential/Platelet   Comprehensive metabolic panel with GFR   Lipid panel   Hemoglobin A1c   VITAMIN D 25 Hydroxy (Vit-D Deficiency, Fractures)   Magnesium   Hyperlipidemia, mixed   Not on statin, labs today. Recommend weight managment. I recommend consuming a heart healthy diet such as Mediterranean diet or DASH diet with whole grains, fruits, vegetable,  fish, lean meats, nuts, and olive oil. Limit sweets and processed foods. I also encourage moderate intensity exercise 150 minutes weekly. This is 3-5 times weekly for 30-50 minutes each session. Goal should be pace of 3 miles/hours, or walking 1.5 miles in 30 minutes.      Relevant Orders   CBC with Differential/Platelet   Comprehensive metabolic panel with GFR   Lipid panel   Hemoglobin A1c   VITAMIN D 25 Hydroxy (Vit-D Deficiency, Fractures)   Magnesium   Vitamin D deficiency   Vitamin D level      Relevant Orders   VITAMIN D 25 Hydroxy (Vit-D Deficiency, Fractures)   GERD (gastroesophageal reflux disease)   Chronic well controlled on Omprazole. 20mg  daily. Anti-reflux measures such as raising the head of the bed, avoiding tight clothing or belts, avoiding eating late at night and not lying down shortly after mealtime and achieving weight loss are discussed. Avoid ASA, NSAID's, caffeine, peppermints, alcohol and tobacco. Alert me if there are persistent symptoms, dysphagia, weight loss or GI bleeding. Mag level drawn      Relevant Orders   Magnesium   Aortic atherosclerosis (HCC)   HTN, HLD, prediabetes well controlled. Non-fasting labs today. I recommend consuming a heart healthy diet such as Mediterranean diet or DASH diet with whole grains, fruits, vegetable, fish, lean meats, nuts, and olive oil. Limit sweets and processed foods. I also encourage moderate intensity exercise 150 minutes weekly. This is 3-5 times weekly for 30-50 minutes each session. Goal should be pace of 3 miles/hours, or walking 1.5 miles in 30 minutes.      Morbid obesity (HCC)   Counseled on importance of weight management for overall health. Encouraged low calorie, heart healthy diet and moderate intensity exercise 150 minutes weekly. This is 3-5 times weekly for 30-50 minutes each session. Goal should be pace of 3 miles/hours, or walking 1.5 miles in 30 minutes and include strength training. Discussed risks of  obesity.      Relevant Orders   CBC with Differential/Platelet   Comprehensive metabolic panel with GFR   Lipid panel   Hemoglobin A1c   VITAMIN D 25 Hydroxy (Vit-D Deficiency, Fractures)   Magnesium   Physical exam, annual - Primary   Today your medical history was reviewed and routine physical exam with labs was performed. Recommend 150 minutes of moderate intensity exercise weekly and consuming a well-balanced diet. Advised to stop smoking if a smoker, avoid smoking if a non-smoker, limit alcohol consumption to 1 drink per day for women and 2 drinks per day for men, and avoid illicit drug use. Counseled on the importance of sunscreen use. Counseled in mental health awareness and when to seek medical care. Vaccine maintenance discussed. Appropriate health maintenance items reviewed. Return to office in 1 year for annual physical exam.       Relevant Orders   CBC with Differential/Platelet   Comprehensive metabolic panel with GFR   Lipid panel   Hemoglobin A1c   VITAMIN D 25 Hydroxy (Vit-D Deficiency, Fractures)   Magnesium    Return in about 6 months (around 01/07/2024) for chronic follow-up with labs  1 week prior.   Park Meo, FNP

## 2023-07-08 NOTE — Assessment & Plan Note (Signed)
Vitamin D level 

## 2023-07-08 NOTE — Assessment & Plan Note (Addendum)
 Not on statin, labs today. Recommend weight managment. I recommend consuming a heart healthy diet such as Mediterranean diet or DASH diet with whole grains, fruits, vegetable, fish, lean meats, nuts, and olive oil. Limit sweets and processed foods. I also encourage moderate intensity exercise 150 minutes weekly. This is 3-5 times weekly for 30-50 minutes each session. Goal should be pace of 3 miles/hours, or walking 1.5 miles in 30 minutes.

## 2023-07-08 NOTE — Patient Instructions (Signed)
 It was great to meet you today and I'm excited to have you join the [facility] practice. I hope you had a positive experience today! If you feel so inclined, please feel free to recommend our practice to friends and family. Kurtis Bushman, FNP-C

## 2023-07-08 NOTE — Assessment & Plan Note (Signed)
Today your medical history was reviewed and routine physical exam with labs was performed. Recommend 150 minutes of moderate intensity exercise weekly and consuming a well-balanced diet. Advised to stop smoking if a smoker, avoid smoking if a non-smoker, limit alcohol consumption to 1 drink per day for women and 2 drinks per day for men, and avoid illicit drug use. Counseled on the importance of sunscreen use. Counseled in mental health awareness and when to seek medical care. Vaccine maintenance discussed. Appropriate health maintenance items reviewed. Return to office in 1 year for annual physical exam.

## 2023-07-08 NOTE — Assessment & Plan Note (Signed)
 Chronic well controlled on Omprazole. 20mg  daily. Anti-reflux measures such as raising the head of the bed, avoiding tight clothing or belts, avoiding eating late at night and not lying down shortly after mealtime and achieving weight loss are discussed. Avoid ASA, NSAID's, caffeine, peppermints, alcohol and tobacco. Alert me if there are persistent symptoms, dysphagia, weight loss or GI bleeding. Mag level drawn

## 2023-07-08 NOTE — Assessment & Plan Note (Signed)
 Counseled on importance of weight management for overall health. Encouraged low calorie, heart healthy diet and moderate intensity exercise 150 minutes weekly. This is 3-5 times weekly for 30-50 minutes each session. Goal should be pace of 3 miles/hours, or walking 1.5 miles in 30 minutes and include strength training. Discussed risks of obesity.

## 2023-07-08 NOTE — Assessment & Plan Note (Addendum)
 Chronic well controlled on Atenolol, hydrochlorothiazide, Losartan, Minoxidil. Recommend weight management. Recommend heart healthy diet such as Mediterranean diet with whole grains, fruits, vegetable, fish, lean meats, nuts, and olive oil. Limit salt. Encouraged moderate walking, 3-5 times/week for 30-50 minutes each session. Aim for at least 150 minutes.week. Goal should be pace of 3 miles/hours, or walking 1.5 miles in 30 minutes. Avoid tobacco products. Avoid excess alcohol. Take medications as prescribed and bring medications and blood pressure log with cuff to each office visit. Seek medical care for chest pain, palpitations, shortness of breath with exertion, dizziness/lightheadedness, vision changes, recurrent headaches, or swelling of extremities. Follow up in 6 months or sooner if needed. Labs today.

## 2023-07-09 LAB — COMPREHENSIVE METABOLIC PANEL WITH GFR
AG Ratio: 1.7 (calc) (ref 1.0–2.5)
ALT: 42 U/L (ref 9–46)
AST: 28 U/L (ref 10–35)
Albumin: 4.6 g/dL (ref 3.6–5.1)
Alkaline phosphatase (APISO): 38 U/L (ref 35–144)
BUN: 16 mg/dL (ref 7–25)
CO2: 31 mmol/L (ref 20–32)
Calcium: 9.9 mg/dL (ref 8.6–10.3)
Chloride: 100 mmol/L (ref 98–110)
Creat: 1.26 mg/dL (ref 0.70–1.35)
Globulin: 2.7 g/dL (ref 1.9–3.7)
Glucose, Bld: 98 mg/dL (ref 65–99)
Potassium: 4.4 mmol/L (ref 3.5–5.3)
Sodium: 141 mmol/L (ref 135–146)
Total Bilirubin: 0.8 mg/dL (ref 0.2–1.2)
Total Protein: 7.3 g/dL (ref 6.1–8.1)
eGFR: 62 mL/min/{1.73_m2} (ref >=60)

## 2023-07-09 LAB — CBC WITH DIFFERENTIAL/PLATELET
Absolute Lymphocytes: 1454 {cells}/uL (ref 850–3900)
Absolute Monocytes: 552 {cells}/uL (ref 200–950)
Basophils Absolute: 29 {cells}/uL (ref 0–200)
Basophils Relative: 0.6 %
Eosinophils Absolute: 110 {cells}/uL (ref 15–500)
Eosinophils Relative: 2.3 %
HCT: 48.6 % (ref 38.5–50.0)
Hemoglobin: 16.3 g/dL (ref 13.2–17.1)
MCH: 29.3 pg (ref 27.0–33.0)
MCHC: 33.5 g/dL (ref 32.0–36.0)
MCV: 87.4 fL (ref 80.0–100.0)
MPV: 10.2 fL (ref 7.5–12.5)
Monocytes Relative: 11.5 %
Neutro Abs: 2654 {cells}/uL (ref 1500–7800)
Neutrophils Relative %: 55.3 %
Platelets: 219 10*3/uL (ref 140–400)
RBC: 5.56 10*6/uL (ref 4.20–5.80)
RDW: 13 % (ref 11.0–15.0)
Total Lymphocyte: 30.3 %
WBC: 4.8 10*3/uL (ref 3.8–10.8)

## 2023-07-09 LAB — VITAMIN D 25 HYDROXY (VIT D DEFICIENCY, FRACTURES): Vit D, 25-Hydroxy: 86 ng/mL (ref 30–100)

## 2023-07-09 LAB — HEMOGLOBIN A1C
Hgb A1c MFr Bld: 6.1 %{Hb} — ABNORMAL HIGH (ref <5.7)
Mean Plasma Glucose: 128 mg/dL
eAG (mmol/L): 7.1 mmol/L

## 2023-07-09 LAB — LIPID PANEL
Cholesterol: 192 mg/dL (ref <200)
HDL: 44 mg/dL (ref >=40)
LDL Cholesterol (Calc): 122 mg/dL — ABNORMAL HIGH
Non-HDL Cholesterol (Calc): 148 mg/dL — ABNORMAL HIGH (ref <130)
Total CHOL/HDL Ratio: 4.4 (calc) (ref <5.0)
Triglycerides: 138 mg/dL (ref <150)

## 2023-07-09 LAB — MAGNESIUM: Magnesium: 2.1 mg/dL (ref 1.5–2.5)

## 2023-07-10 ENCOUNTER — Other Ambulatory Visit: Payer: Self-pay

## 2023-07-10 DIAGNOSIS — I10 Essential (primary) hypertension: Secondary | ICD-10-CM

## 2023-07-10 MED ORDER — ROSUVASTATIN CALCIUM 10 MG PO TABS: 10.0000 mg | ORAL_TABLET | Freq: Every day | ORAL | 3 refills | Status: DC

## 2023-07-10 MED ORDER — ATENOLOL 100 MG PO TABS: 100.0000 mg | ORAL_TABLET | Freq: Every day | ORAL | 0 refills | Status: DC

## 2023-07-22 ENCOUNTER — Other Ambulatory Visit: Payer: Self-pay | Admitting: Family Medicine

## 2023-07-22 DIAGNOSIS — I10 Essential (primary) hypertension: Secondary | ICD-10-CM

## 2023-08-14 ENCOUNTER — Other Ambulatory Visit: Payer: Self-pay

## 2023-08-14 ENCOUNTER — Encounter: Payer: Self-pay | Admitting: Family Medicine

## 2023-08-14 DIAGNOSIS — I10 Essential (primary) hypertension: Secondary | ICD-10-CM

## 2023-08-14 MED ORDER — MINOXIDIL 10 MG PO TABS: ORAL_TABLET | ORAL | 3 refills | Status: AC

## 2023-08-16 ENCOUNTER — Encounter: Payer: Self-pay | Admitting: Family Medicine

## 2023-08-17 ENCOUNTER — Other Ambulatory Visit: Payer: Self-pay

## 2023-08-17 ENCOUNTER — Other Ambulatory Visit: Payer: Self-pay | Admitting: Family

## 2023-08-17 DIAGNOSIS — I10 Essential (primary) hypertension: Secondary | ICD-10-CM

## 2023-08-17 MED ORDER — LOSARTAN POTASSIUM 50 MG PO TABS: ORAL_TABLET | ORAL | 0 refills | Status: DC

## 2023-08-24 ENCOUNTER — Encounter (INDEPENDENT_AMBULATORY_CARE_PROVIDER_SITE_OTHER): Admitting: Family Medicine

## 2023-08-24 ENCOUNTER — Encounter: Payer: Self-pay | Admitting: Family Medicine

## 2023-08-24 ENCOUNTER — Other Ambulatory Visit

## 2023-08-24 VITALS — BP 138/68 | HR 67 | Ht 68.0 in | Wt 239.5 lb

## 2023-08-24 DIAGNOSIS — E782 Mixed hyperlipidemia: Secondary | ICD-10-CM | POA: Diagnosis not present

## 2023-08-24 LAB — COMPREHENSIVE METABOLIC PANEL WITH GFR
AG Ratio: 1.8 (calc) (ref 1.0–2.5)
ALT: 29 U/L (ref 9–46)
AST: 22 U/L (ref 10–35)
Albumin: 4.6 g/dL (ref 3.6–5.1)
Alkaline phosphatase (APISO): 35 U/L (ref 35–144)
BUN: 20 mg/dL (ref 7–25)
CO2: 33 mmol/L — ABNORMAL HIGH (ref 20–32)
Calcium: 9.7 mg/dL (ref 8.6–10.3)
Chloride: 100 mmol/L (ref 98–110)
Creat: 1.23 mg/dL (ref 0.70–1.35)
Globulin: 2.6 g/dL (ref 1.9–3.7)
Glucose, Bld: 100 mg/dL — ABNORMAL HIGH (ref 65–99)
Potassium: 4.3 mmol/L (ref 3.5–5.3)
Sodium: 140 mmol/L (ref 135–146)
Total Bilirubin: 0.8 mg/dL (ref 0.2–1.2)
Total Protein: 7.2 g/dL (ref 6.1–8.1)
eGFR: 64 mL/min/{1.73_m2} (ref >=60)

## 2023-08-24 LAB — LIPID PANEL
Cholesterol: 123 mg/dL (ref <200)
HDL: 42 mg/dL (ref >=40)
LDL Cholesterol (Calc): 58 mg/dL
Non-HDL Cholesterol (Calc): 81 mg/dL (ref <130)
Total CHOL/HDL Ratio: 2.9 (calc) (ref <5.0)
Triglycerides: 150 mg/dL — ABNORMAL HIGH (ref <150)

## 2023-08-24 NOTE — Progress Notes (Deleted)
   Subjective:  HPI: Wesley Oneal is a 68 y.o. male presenting on 08/24/2023 for No chief complaint on file.   HPI Patient is in today for ***  ROS  Relevant past medical history reviewed and updated as indicated.   Past Medical History:  Diagnosis Date   Hyperlipidemia    Hypertension Feb 2017   Prediabetes Feb 2017    Vitamin D  deficiency disease      History reviewed. No pertinent surgical history.  Allergies and medications reviewed and updated.   Current Outpatient Medications:    aspirin  EC 81 MG tablet, Take   1 tablet    Daily, Disp: 1 tablet, Rfl:    atenolol  (TENORMIN ) 100 MG tablet, Take 1 tablet (100 mg total) by mouth daily. for blood pressure, Disp: 90 tablet, Rfl: 0   Cholecalciferol (VITAMIN D  PO), Take 5,000 Units by mouth daily., Disp: , Rfl:    hydrochlorothiazide  (HYDRODIURIL ) 25 MG tablet, TAKE 1 TABLET BY MOUTH DAILY AS NEEDED FOR BLOOD PRESSURE AND FLUID RETENTION OR ANKLE SWELLING, Disp: 90 tablet, Rfl: 3   losartan  (COZAAR ) 50 MG tablet, TAKE 1 TABLET BY MOUTH DAILY FOR BLOOD PRESSURE, Disp: 90 tablet, Rfl: 0   Magnesium 500 MG TABS, Take 1 tablet by mouth daily., Disp: , Rfl:    minoxidil  (LONITEN ) 10 MG tablet, TAKE 1/2 TO 1 TABLET BY MOUTH DAILY FOR BLOOD PRESSURE, Disp: 90 tablet, Rfl: 3   Multiple Vitamins-Minerals (HAIR SKIN NAILS PO), Take by mouth daily., Disp: , Rfl:    Omeprazole Magnesium (PRILOSEC OTC PO), Take 20 mg by mouth daily., Disp: , Rfl:    rosuvastatin  (CRESTOR ) 10 MG tablet, Take 1 tablet (10 mg total) by mouth daily., Disp: 90 tablet, Rfl: 3   tadalafil  (CIALIS ) 20 MG tablet, TAKE 1/2 TO ONE TABLET BY MOUTH EVERY 2 TO 3 DAYS IF NEEDED FOR XXXX, Disp: 30 tablet, Rfl: 1   zinc gluconate 50 MG tablet, Take 50 mg by mouth daily., Disp: , Rfl:   Allergies  Allergen Reactions   Lisinopril  Cough   Penicillins     Objective:   BP 138/68   Pulse 67   Ht 5\' 8"  (1.727 m)   Wt 239 lb 8 oz (108.6 kg)   SpO2 96%   BMI 36.42  kg/m      08/24/2023    8:12 AM 07/08/2023   10:42 AM 11/25/2022   10:48 AM  Vitals with BMI  Height 5\' 8"  5' 8.5" 5' 8.5"  Weight 239 lbs 8 oz 236 lbs 244 lbs  BMI 36.42 35.36 36.56  Systolic 138 120 914  Diastolic 68 68 80  Pulse 67 81 74     Physical Exam  Assessment & Plan:  There are no diagnoses linked to this encounter.   Follow up plan: No follow-ups on file.  Jenelle Mis, FNP

## 2023-08-25 NOTE — Progress Notes (Signed)
 Erroneous, labs only visit

## 2023-10-04 ENCOUNTER — Encounter: Payer: Self-pay | Admitting: Family Medicine

## 2023-10-05 ENCOUNTER — Other Ambulatory Visit: Payer: Self-pay

## 2023-10-05 DIAGNOSIS — I10 Essential (primary) hypertension: Secondary | ICD-10-CM

## 2023-10-05 MED ORDER — ATENOLOL 100 MG PO TABS: 100.0000 mg | ORAL_TABLET | Freq: Every day | ORAL | 0 refills | Status: DC

## 2023-10-20 ENCOUNTER — Other Ambulatory Visit: Payer: Self-pay | Admitting: Family Medicine

## 2023-10-20 DIAGNOSIS — I10 Essential (primary) hypertension: Secondary | ICD-10-CM

## 2023-11-03 ENCOUNTER — Other Ambulatory Visit: Payer: Self-pay | Admitting: Nurse Practitioner

## 2023-11-03 DIAGNOSIS — I10 Essential (primary) hypertension: Secondary | ICD-10-CM

## 2023-11-17 ENCOUNTER — Other Ambulatory Visit: Payer: Self-pay | Admitting: Family Medicine

## 2023-11-17 DIAGNOSIS — I10 Essential (primary) hypertension: Secondary | ICD-10-CM

## 2023-12-30 ENCOUNTER — Encounter: Payer: Self-pay | Admitting: Family Medicine

## 2023-12-30 ENCOUNTER — Other Ambulatory Visit: Payer: Self-pay

## 2023-12-30 DIAGNOSIS — E782 Mixed hyperlipidemia: Secondary | ICD-10-CM

## 2023-12-30 DIAGNOSIS — I10 Essential (primary) hypertension: Secondary | ICD-10-CM

## 2023-12-30 MED ORDER — ATENOLOL 100 MG PO TABS: 100.0000 mg | ORAL_TABLET | Freq: Every day | ORAL | 1 refills | Status: AC

## 2023-12-30 MED ORDER — HYDROCHLOROTHIAZIDE 25 MG PO TABS: ORAL_TABLET | ORAL | 1 refills | Status: AC

## 2023-12-30 MED ORDER — ROSUVASTATIN CALCIUM 10 MG PO TABS: 10.0000 mg | ORAL_TABLET | Freq: Every day | ORAL | 1 refills | Status: AC

## 2023-12-31 ENCOUNTER — Other Ambulatory Visit

## 2023-12-31 DIAGNOSIS — I10 Essential (primary) hypertension: Secondary | ICD-10-CM | POA: Diagnosis not present

## 2023-12-31 DIAGNOSIS — E782 Mixed hyperlipidemia: Secondary | ICD-10-CM | POA: Diagnosis not present

## 2024-01-01 LAB — COMPREHENSIVE METABOLIC PANEL WITH GFR
AG Ratio: 1.8 (calc) (ref 1.0–2.5)
ALT: 19 U/L (ref 9–46)
AST: 19 U/L (ref 10–35)
Albumin: 4.2 g/dL (ref 3.6–5.1)
Alkaline phosphatase (APISO): 33 U/L — ABNORMAL LOW (ref 35–144)
BUN: 14 mg/dL (ref 7–25)
CO2: 32 mmol/L (ref 20–32)
Calcium: 9.5 mg/dL (ref 8.6–10.3)
Chloride: 99 mmol/L (ref 98–110)
Creat: 1.2 mg/dL (ref 0.70–1.35)
Globulin: 2.3 g/dL (ref 1.9–3.7)
Glucose, Bld: 112 mg/dL — ABNORMAL HIGH (ref 65–99)
Potassium: 4.8 mmol/L (ref 3.5–5.3)
Sodium: 138 mmol/L (ref 135–146)
Total Bilirubin: 0.7 mg/dL (ref 0.2–1.2)
Total Protein: 6.5 g/dL (ref 6.1–8.1)
eGFR: 66 mL/min/1.73m2 (ref >=60)

## 2024-01-01 LAB — CBC WITH DIFFERENTIAL/PLATELET
Absolute Lymphocytes: 1853 {cells}/uL (ref 850–3900)
Absolute Monocytes: 656 {cells}/uL (ref 200–950)
Basophils Absolute: 40 {cells}/uL (ref 0–200)
Basophils Relative: 0.7 %
Eosinophils Absolute: 120 {cells}/uL (ref 15–500)
Eosinophils Relative: 2.1 %
HCT: 46.7 % (ref 38.5–50.0)
Hemoglobin: 15.4 g/dL (ref 13.2–17.1)
MCH: 29.4 pg (ref 27.0–33.0)
MCHC: 33 g/dL (ref 32.0–36.0)
MCV: 89.1 fL (ref 80.0–100.0)
MPV: 10 fL (ref 7.5–12.5)
Monocytes Relative: 11.5 %
Neutro Abs: 3032 {cells}/uL (ref 1500–7800)
Neutrophils Relative %: 53.2 %
Platelets: 198 Thousand/uL (ref 140–400)
RBC: 5.24 Million/uL (ref 4.20–5.80)
RDW: 13 % (ref 11.0–15.0)
Total Lymphocyte: 32.5 %
WBC: 5.7 Thousand/uL (ref 3.8–10.8)

## 2024-01-01 LAB — LIPID PANEL
Cholesterol: 103 mg/dL (ref <200)
HDL: 38 mg/dL — ABNORMAL LOW (ref >=40)
LDL Cholesterol (Calc): 40 mg/dL
Non-HDL Cholesterol (Calc): 65 mg/dL (ref <130)
Total CHOL/HDL Ratio: 2.7 (calc) (ref <5.0)
Triglycerides: 171 mg/dL — ABNORMAL HIGH (ref <150)

## 2024-01-01 LAB — HEMOGLOBIN A1C
Hgb A1c MFr Bld: 6 % — ABNORMAL HIGH (ref <5.7)
Mean Plasma Glucose: 126 mg/dL
eAG (mmol/L): 7 mmol/L

## 2024-01-06 ENCOUNTER — Ambulatory Visit: Payer: Self-pay | Admitting: Family Medicine

## 2024-01-07 ENCOUNTER — Encounter: Payer: Self-pay | Admitting: Family Medicine

## 2024-01-07 ENCOUNTER — Ambulatory Visit: Admitting: Family Medicine

## 2024-01-07 VITALS — BP 132/84 | HR 69 | Temp 98.4°F | Ht 68.0 in | Wt 241.0 lb

## 2024-01-07 DIAGNOSIS — K219 Gastro-esophageal reflux disease without esophagitis: Secondary | ICD-10-CM

## 2024-01-07 DIAGNOSIS — E782 Mixed hyperlipidemia: Secondary | ICD-10-CM | POA: Diagnosis not present

## 2024-01-07 DIAGNOSIS — I10 Essential (primary) hypertension: Secondary | ICD-10-CM | POA: Diagnosis not present

## 2024-01-07 DIAGNOSIS — E559 Vitamin D deficiency, unspecified: Secondary | ICD-10-CM | POA: Diagnosis not present

## 2024-01-07 DIAGNOSIS — N529 Male erectile dysfunction, unspecified: Secondary | ICD-10-CM

## 2024-01-07 DIAGNOSIS — R7303 Prediabetes: Secondary | ICD-10-CM

## 2024-01-07 DIAGNOSIS — I700 Atherosclerosis of aorta: Secondary | ICD-10-CM

## 2024-01-07 MED ORDER — OMEPRAZOLE 20 MG PO CPDR: 20.0000 mg | DELAYED_RELEASE_CAPSULE | Freq: Every day | ORAL | 3 refills | Status: AC

## 2024-01-07 NOTE — Progress Notes (Signed)
 Subjective:  HPI: Wesley Oneal is a 68 y.o. male presenting on 01/07/2024 for Medical Management of Chronic Issues   HPI Patient is in today for chronic condition management. No new concerns. PMH includes aortic atherosclerosis, HLD, HTN, prediabetes, ED, GERD, Vitamin D  deficiency.   HTN: on Atenolol , hydrochlorothiazide , losartan , Minoxidil  Denies chest pain, palpitations, recurrent headaches, vision changes, lightheadedness, dizziness, dyspnea on exertion, or swelling of extremities.  HLD: on Rosuvastatin  10mg  daily Lipid Panel     Component Value Date/Time   CHOL 103 12/31/2023 0822   TRIG 171 (H) 12/31/2023 0822   HDL 38 (L) 12/31/2023 0822   CHOLHDL 2.7 12/31/2023 0822   VLDL 34 (H) 06/25/2016 1005   LDLCALC 40 12/31/2023 0822  The ASCVD Risk score (Arnett DK, et al., 2019) failed to calculate for the following reasons:   The valid total cholesterol range is 130 to 320 mg/dL  Prediabetes: J8r 3.9% Denies polyuria, polydypsia, chest pain, paresthesias, or chest pain.  Aortic atherosclerosis: optimized HTN, HLD, preDM  GERD: on Omeprazole 20mg  daily Denies hematemesis, melena, hematochezia, occult blood in stool, iron deficiency anemia, anorexia, unexplained weight loss, dysphagia, odynophagia, persistent vomiting  Vit D Deficiency: daily vitamin D  5000 units  ED: PRN Cialis    Review of Systems  All other systems reviewed and are negative.   Relevant past medical history reviewed and updated as indicated.   Past Medical History:  Diagnosis Date   Hyperlipidemia    Hypertension Feb 2017   Prediabetes Feb 2017    Vitamin D  deficiency disease      History reviewed. No pertinent surgical history.  Allergies and medications reviewed and updated.   Current Outpatient Medications:    omeprazole (PRILOSEC) 20 MG capsule, Take 1 capsule (20 mg total) by mouth daily., Disp: 90 capsule, Rfl: 3   aspirin  EC 81 MG tablet, Take   1 tablet    Daily, Disp: 1  tablet, Rfl:    atenolol  (TENORMIN ) 100 MG tablet, Take 1 tablet (100 mg total) by mouth daily., Disp: 90 tablet, Rfl: 1   Cholecalciferol (VITAMIN D  PO), Take 5,000 Units by mouth daily., Disp: , Rfl:    hydrochlorothiazide  (HYDRODIURIL ) 25 MG tablet, TAKE 1 TABLET BY MOUTH DAILY AS NEEDED FOR BLOOD PRESSURE AND FLUID RETENTION OR ANKLE SWELLING, Disp: 90 tablet, Rfl: 1   losartan  (COZAAR ) 50 MG tablet, TAKE 1 TABLET BY MOUTH DAILY FOR BLOOD PRESSURE, Disp: 90 tablet, Rfl: 0   Magnesium 500 MG TABS, Take 1 tablet by mouth daily., Disp: , Rfl:    minoxidil  (LONITEN ) 10 MG tablet, TAKE 1/2 TO 1 TABLET BY MOUTH DAILY FOR BLOOD PRESSURE, Disp: 90 tablet, Rfl: 3   Multiple Vitamins-Minerals (HAIR SKIN NAILS PO), Take by mouth daily., Disp: , Rfl:    rosuvastatin  (CRESTOR ) 10 MG tablet, Take 1 tablet (10 mg total) by mouth daily., Disp: 90 tablet, Rfl: 1   tadalafil  (CIALIS ) 20 MG tablet, TAKE 1/2 TO ONE TABLET BY MOUTH EVERY 2 TO 3 DAYS IF NEEDED FOR XXXX, Disp: 30 tablet, Rfl: 1   zinc gluconate 50 MG tablet, Take 50 mg by mouth daily., Disp: , Rfl:   Allergies  Allergen Reactions   Lisinopril  Cough   Penicillins     Objective:   BP 132/84   Pulse 69   Temp 98.4 F (36.9 C)   Ht 5' 8 (1.727 m)   Wt 241 lb (109.3 kg)   SpO2 98%   BMI 36.64 kg/m  01/07/2024    7:52 AM 08/24/2023    8:12 AM 07/08/2023   10:42 AM  Vitals with BMI  Height 5' 8 5' 8 5' 8.5  Weight 241 lbs 239 lbs 8 oz 236 lbs  BMI 36.65 36.42 35.36  Systolic 132 138 879  Diastolic 84 68 68  Pulse 69 67 81     Physical Exam Vitals and nursing note reviewed.  Constitutional:      Appearance: Normal appearance. He is obese.  HENT:     Head: Normocephalic and atraumatic.  Cardiovascular:     Rate and Rhythm: Normal rate and regular rhythm.     Pulses: Normal pulses.     Heart sounds: Normal heart sounds.  Pulmonary:     Effort: Pulmonary effort is normal.     Breath sounds: Normal breath sounds.   Skin:    General: Skin is warm and dry.     Capillary Refill: Capillary refill takes less than 2 seconds.  Neurological:     General: No focal deficit present.     Mental Status: He is alert and oriented to person, place, and time. Mental status is at baseline.  Psychiatric:        Mood and Affect: Mood normal.        Behavior: Behavior normal.        Thought Content: Thought content normal.        Judgment: Judgment normal.     Assessment & Plan:  Essential hypertension Assessment & Plan: Well controlled on Atenolol , hydrochlorothiazide , losartan , minoxidil  Recommend heart healthy diet such as Mediterranean diet with whole grains, fruits, vegetable, fish, lean meats, nuts, and olive oil. Limit salt. Encouraged moderate walking, 3-5 times/week for 30-50 minutes each session. Aim for at least 150 minutes.week. Goal should be pace of 3 miles/hours, or walking 1.5 miles in 30 minutes. Avoid tobacco products. Avoid excess alcohol. Take medications as prescribed and bring medications and blood pressure log with cuff to each office visit. Seek medical care for chest pain, palpitations, shortness of breath with exertion, dizziness/lightheadedness, vision changes, recurrent headaches, or swelling of extremities.     Morbid obesity (HCC) Assessment & Plan: Counseled on importance of weight management for overall health. Encouraged low calorie, heart healthy diet and moderate intensity exercise 150 minutes weekly. This is 3-5 times weekly for 30-50 minutes each session. Goal should be pace of 3 miles/hours, or walking 1.5 miles in 30 minutes and include strength training. Discussed risks of obesity.   Hyperlipidemia, mixed Assessment & Plan: Continue rosuvastatin  10mg  daily.  Recommend weight managment. I recommend consuming a heart healthy diet such as Mediterranean diet or DASH diet with whole grains, fruits, vegetable, fish, lean meats, nuts, and olive oil. Limit sweets and processed foods.  I also encourage moderate intensity exercise 150 minutes weekly. This is 3-5 times weekly for 30-50 minutes each session. Goal should be pace of 3 miles/hours, or walking 1.5 miles in 30 minutes.   Primary hypertension Assessment & Plan: Chronic well controlled on Atenolol , hydrochlorothiazide , Losartan , Minoxidil . Recommend weight management. Recommend heart healthy diet such as Mediterranean diet with whole grains, fruits, vegetable, fish, lean meats, nuts, and olive oil. Limit salt. Encouraged moderate walking, 3-5 times/week for 30-50 minutes each session. Aim for at least 150 minutes.week. Goal should be pace of 3 miles/hours, or walking 1.5 miles in 30 minutes. Avoid tobacco products. Avoid excess alcohol. Take medications as prescribed and bring medications and blood pressure log with cuff to each office visit. Seek  medical care for chest pain, palpitations, shortness of breath with exertion, dizziness/lightheadedness, vision changes, recurrent headaches, or swelling of extremities. Follow up in 6 months or sooner if needed.   Vitamin D  deficiency Assessment & Plan: Continue vitamin d  supplement   Gastroesophageal reflux disease, unspecified whether esophagitis present Assessment & Plan: Chronic well controlled on Omprazole. 20mg  daily. Anti-reflux measures such as raising the head of the bed, avoiding tight clothing or belts, avoiding eating late at night and not lying down shortly after mealtime and achieving weight loss are discussed. Avoid ASA, NSAID's, caffeine, peppermints, alcohol and tobacco. Alert me if there are persistent symptoms, dysphagia, weight loss or GI bleeding.    Aortic atherosclerosis Assessment & Plan: Per CXR 08/2020 HTN, HLD, prediabetes well controlled. I recommend consuming a heart healthy diet such as Mediterranean diet or DASH diet with whole grains, fruits, vegetable, fish, lean meats, nuts, and olive oil. Limit sweets and processed foods. I also encourage  moderate intensity exercise 150 minutes weekly. This is 3-5 times weekly for 30-50 minutes each session. Goal should be pace of 3 miles/hours, or walking 1.5 miles in 30 minutes.   Prediabetes Assessment & Plan: A1c 6.0%   Erectile dysfunction, unspecified erectile dysfunction type Assessment & Plan: PRN Cialis    Other orders -     Omeprazole; Take 1 capsule (20 mg total) by mouth daily.  Dispense: 90 capsule; Refill: 3     Follow up plan: Return in about 6 months (around 07/07/2024) for annual physical with labs 1 week prior.  Wesley GORMAN Barrio, FNP

## 2024-01-07 NOTE — Assessment & Plan Note (Signed)
 Well controlled on Atenolol , hydrochlorothiazide , losartan , minoxidil  Recommend heart healthy diet such as Mediterranean diet with whole grains, fruits, vegetable, fish, lean meats, nuts, and olive oil. Limit salt. Encouraged moderate walking, 3-5 times/week for 30-50 minutes each session. Aim for at least 150 minutes.week. Goal should be pace of 3 miles/hours, or walking 1.5 miles in 30 minutes. Avoid tobacco products. Avoid excess alcohol. Take medications as prescribed and bring medications and blood pressure log with cuff to each office visit. Seek medical care for chest pain, palpitations, shortness of breath with exertion, dizziness/lightheadedness, vision changes, recurrent headaches, or swelling of extremities.

## 2024-01-07 NOTE — Assessment & Plan Note (Signed)
PRN Cialis

## 2024-01-07 NOTE — Assessment & Plan Note (Signed)
 Chronic well controlled on Atenolol , hydrochlorothiazide , Losartan , Minoxidil . Recommend weight management. Recommend heart healthy diet such as Mediterranean diet with whole grains, fruits, vegetable, fish, lean meats, nuts, and olive oil. Limit salt. Encouraged moderate walking, 3-5 times/week for 30-50 minutes each session. Aim for at least 150 minutes.week. Goal should be pace of 3 miles/hours, or walking 1.5 miles in 30 minutes. Avoid tobacco products. Avoid excess alcohol. Take medications as prescribed and bring medications and blood pressure log with cuff to each office visit. Seek medical care for chest pain, palpitations, shortness of breath with exertion, dizziness/lightheadedness, vision changes, recurrent headaches, or swelling of extremities. Follow up in 6 months or sooner if needed.

## 2024-01-07 NOTE — Assessment & Plan Note (Signed)
 Continue rosuvastatin  10mg  daily.  Recommend weight managment. I recommend consuming a heart healthy diet such as Mediterranean diet or DASH diet with whole grains, fruits, vegetable, fish, lean meats, nuts, and olive oil. Limit sweets and processed foods. I also encourage moderate intensity exercise 150 minutes weekly. This is 3-5 times weekly for 30-50 minutes each session. Goal should be pace of 3 miles/hours, or walking 1.5 miles in 30 minutes.

## 2024-01-07 NOTE — Assessment & Plan Note (Signed)
A1c 6.0%. 

## 2024-01-07 NOTE — Assessment & Plan Note (Signed)
 Continue vitamin d supplement

## 2024-01-07 NOTE — Assessment & Plan Note (Signed)
 Per CXR 08/2020 HTN, HLD, prediabetes well controlled. I recommend consuming a heart healthy diet such as Mediterranean diet or DASH diet with whole grains, fruits, vegetable, fish, lean meats, nuts, and olive oil. Limit sweets and processed foods. I also encourage moderate intensity exercise 150 minutes weekly. This is 3-5 times weekly for 30-50 minutes each session. Goal should be pace of 3 miles/hours, or walking 1.5 miles in 30 minutes.

## 2024-01-07 NOTE — Assessment & Plan Note (Addendum)
 Chronic well controlled on Omprazole. 20mg  daily. Anti-reflux measures such as raising the head of the bed, avoiding tight clothing or belts, avoiding eating late at night and not lying down shortly after mealtime and achieving weight loss are discussed. Avoid ASA, NSAID's, caffeine, peppermints, alcohol and tobacco. Alert me if there are persistent symptoms, dysphagia, weight loss or GI bleeding.

## 2024-01-07 NOTE — Assessment & Plan Note (Signed)
 Counseled on importance of weight management for overall health. Encouraged low calorie, heart healthy diet and moderate intensity exercise 150 minutes weekly. This is 3-5 times weekly for 30-50 minutes each session. Goal should be pace of 3 miles/hours, or walking 1.5 miles in 30 minutes and include strength training. Discussed risks of obesity.

## 2024-02-15 ENCOUNTER — Other Ambulatory Visit: Payer: Self-pay | Admitting: Family Medicine

## 2024-02-15 DIAGNOSIS — I10 Essential (primary) hypertension: Secondary | ICD-10-CM

## 2024-05-09 ENCOUNTER — Other Ambulatory Visit: Payer: Self-pay | Admitting: Nurse Practitioner

## 2024-05-09 DIAGNOSIS — I10 Essential (primary) hypertension: Secondary | ICD-10-CM

## 2024-05-13 ENCOUNTER — Encounter: Payer: Self-pay | Admitting: Family Medicine

## 2024-05-13 ENCOUNTER — Other Ambulatory Visit: Payer: Self-pay

## 2024-05-13 DIAGNOSIS — I10 Essential (primary) hypertension: Secondary | ICD-10-CM

## 2024-05-13 MED ORDER — LOSARTAN POTASSIUM 50 MG PO TABS: 50.0000 mg | ORAL_TABLET | Freq: Every day | ORAL | 0 refills | Status: AC

## 2024-06-29 ENCOUNTER — Other Ambulatory Visit

## 2024-07-13 ENCOUNTER — Encounter: Admitting: Family Medicine
